# Patient Record
Sex: Male | Born: 1985 | Race: Black or African American | Hispanic: No | Marital: Single | Smoking: Current some day smoker
Health system: Southern US, Community
[De-identification: ages and names within clinical notes are randomized; demographics above are authoritative.]

## PROBLEM LIST (undated history)

## (undated) ENCOUNTER — Emergency Department (HOSPITAL_COMMUNITY): Payer: Medicaid Other | Source: Home / Self Care

---
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
## 2007-10-02 ENCOUNTER — Emergency Department (HOSPITAL_COMMUNITY): Admission: EM | Admit: 2007-10-02 | Discharge: 2007-10-02 | Payer: Self-pay | Admitting: Emergency Medicine

## 2008-07-06 ENCOUNTER — Emergency Department (HOSPITAL_COMMUNITY): Admission: EM | Admit: 2008-07-06 | Discharge: 2008-07-06 | Payer: Self-pay | Admitting: Family Medicine

## 2009-10-01 ENCOUNTER — Emergency Department (HOSPITAL_COMMUNITY): Admission: EM | Admit: 2009-10-01 | Discharge: 2009-10-01 | Payer: Self-pay | Admitting: Family Medicine

## 2009-10-09 ENCOUNTER — Emergency Department (HOSPITAL_COMMUNITY): Admission: EM | Admit: 2009-10-09 | Discharge: 2009-10-09 | Payer: Self-pay | Admitting: Emergency Medicine

## 2010-08-11 LAB — URINE CULTURE
Colony Count: NO GROWTH
Culture: NO GROWTH

## 2010-08-11 LAB — POCT URINALYSIS DIP (DEVICE)
Bilirubin Urine: NEGATIVE
Glucose, UA: NEGATIVE mg/dL
Ketones, ur: NEGATIVE mg/dL
Nitrite: NEGATIVE
Protein, ur: 100 mg/dL — AB
Specific Gravity, Urine: 1.02 (ref 1.005–1.030)
Urobilinogen, UA: 0.2 mg/dL (ref 0.0–1.0)
pH: 7.5 (ref 5.0–8.0)

## 2010-08-11 LAB — GC/CHLAMYDIA PROBE AMP, GENITAL: Chlamydia, DNA Probe: NEGATIVE
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
## 2013-11-15 ENCOUNTER — Encounter (HOSPITAL_COMMUNITY): Payer: Self-pay | Admitting: Emergency Medicine

## 2013-11-15 ENCOUNTER — Emergency Department (HOSPITAL_COMMUNITY)
Admission: EM | Admit: 2013-11-15 | Discharge: 2013-11-15 | Disposition: A | Discharge status: Home / Self Care | Payer: Medicaid Other | Attending: Emergency Medicine | Admitting: Emergency Medicine

## 2013-11-15 ENCOUNTER — Emergency Department (HOSPITAL_COMMUNITY): Payer: Medicaid Other

## 2013-11-15 DIAGNOSIS — N3090 Cystitis, unspecified without hematuria: Secondary | ICD-10-CM | POA: Insufficient documentation

## 2013-11-15 DIAGNOSIS — F17200 Nicotine dependence, unspecified, uncomplicated: Secondary | ICD-10-CM | POA: Insufficient documentation

## 2013-11-15 DIAGNOSIS — Z792 Long term (current) use of antibiotics: Secondary | ICD-10-CM | POA: Insufficient documentation

## 2013-11-15 DIAGNOSIS — A64 Unspecified sexually transmitted disease: Secondary | ICD-10-CM

## 2013-11-15 DIAGNOSIS — Z79899 Other long term (current) drug therapy: Secondary | ICD-10-CM | POA: Insufficient documentation

## 2013-11-15 LAB — URINE MICROSCOPIC-ADD ON

## 2013-11-15 LAB — URINALYSIS, ROUTINE W REFLEX MICROSCOPIC
Bilirubin Urine: NEGATIVE
Glucose, UA: NEGATIVE mg/dL
Ketones, ur: NEGATIVE mg/dL
NITRITE: NEGATIVE
Protein, ur: NEGATIVE mg/dL
SPECIFIC GRAVITY, URINE: 1.014 (ref 1.005–1.030)
UROBILINOGEN UA: 0.2 mg/dL (ref 0.0–1.0)
pH: 6 (ref 5.0–8.0)

## 2013-11-15 MED ORDER — CEFTRIAXONE SODIUM 250 MG IJ SOLR
250.0000 mg | Freq: Once | INTRAMUSCULAR | Status: AC
  Administered 2013-11-15: 250 mg via INTRAMUSCULAR
  Filled 2013-11-15: qty 250

## 2013-11-15 MED ORDER — DOXYCYCLINE HYCLATE 100 MG PO CAPS: 100.0000 mg | ORAL_CAPSULE | Freq: Two times a day (BID) | ORAL | Status: DC

## 2013-11-15 MED ORDER — AZITHROMYCIN 250 MG PO TABS
1000.0000 mg | ORAL_TABLET | Freq: Once | ORAL | Status: AC
  Administered 2013-11-15: 1000 mg via ORAL
  Filled 2013-11-15: qty 4

## 2013-11-15 MED ORDER — LIDOCAINE HCL (PF) 1 % IJ SOLN
5.0000 mL | Freq: Once | INTRAMUSCULAR | Status: AC
  Administered 2013-11-15: 5 mL
  Filled 2013-11-15: qty 5

## 2013-11-15 NOTE — Discharge Instructions (Signed)
Please call and set up an appointment with health department Please take antibiotics as prescribed - please take on a full stomach  Please avoid any sexual encounter until infection has been cleared and has been re-assessed Recommend partner to be assessed and treated Please continue to monitor symptoms closely and if symptoms are to worsen or change (fever greater than 101, chills, chest pain, shortness of breath, difficulty breathing, numbness, tingling, worsening or changes to pain pattern, blood in the urine, blood in the stools, black tarry stools, stomach pain, neck pain, neck stiffness, swelling to the penis or testicles, scrotal pain, rashes to the palms the hands and soles of the feet, sore throat, difficulty breathing) please report back to the ED immediately    Sexually Transmitted Disease A sexually transmitted disease (STD) is a disease or infection that may be passed (transmitted) from person to person, usually during sexual activity. This may happen by way of saliva, semen, blood, vaginal mucus, or urine. Common STDs include:   Gonorrhea.   Chlamydia.   Syphilis.   HIV and AIDS.   Genital herpes.   Hepatitis B and C.   Trichomonas.   Human papillomavirus (HPV).   Pubic lice.   Scabies.  Mites.  Bacterial vaginosis. WHAT ARE CAUSES OF STDs? An STD may be caused by bacteria, a virus, or parasites. STDs are often transmitted during sexual activity if one person is infected. However, they may also be transmitted through nonsexual means. STDs may be transmitted after:   Sexual intercourse with an infected person.   Sharing sex toys with an infected person.   Sharing needles with an infected person or using unclean piercing or tattoo needles.  Having intimate contact with the genitals, mouth, or rectal areas of an infected person.   Exposure to infected fluids during birth. WHAT ARE THE SIGNS AND SYMPTOMS OF STDs? Different STDs have different  symptoms. Some people may not have any symptoms. If symptoms are present, they may include:   Painful or bloody urination.   Pain in the pelvis, abdomen, vagina, anus, throat, or eyes.   A skin rash, itching, or irritation.  Growths, ulcerations, blisters, or sores in the genital and anal areas.  Abnormal vaginal discharge with or without bad odor.   Penile discharge in men.   Fever.   Pain or bleeding during sexual intercourse.   Swollen glands in the groin area.   Yellow skin and eyes (jaundice). This is seen with hepatitis.   Swollen testicles.  Infertility.  Sores and blisters in the mouth. HOW ARE STDs DIAGNOSED? To make a diagnosis, your health care provider may:   Take a medical history.   Perform a physical exam.   Take a sample of any discharge to examine.  Swab the throat, cervix, opening to the penis, rectum, or vagina for testing.  Test a sample of your first morning urine.   Perform blood tests.   Perform a Pap test, if this applies.   Perform a colposcopy.   Perform a laparoscopy.  HOW ARE STDs TREATED? Treatment depends on the STD. Some STDs may be treated but not cured.   Chlamydia, gonorrhea, trichomonas, and syphilis can be cured with antibiotic medicine.   Genital herpes, hepatitis, and HIV can be treated, but not cured, with prescribed medicines. The medicines lessen symptoms.   Genital warts from HPV can be treated with medicine or by freezing, burning (electrocautery), or surgery. Warts may come back.   HPV cannot be cured with medicine or  surgery. However, abnormal areas may be removed from the cervix, vagina, or vulva.   If your diagnosis is confirmed, your recent sexual partners need treatment. This is true even if they are symptom-free or have a negative culture or evaluation. They should not have sex until their health care providers say it is okay. HOW CAN I REDUCE MY RISK OF GETTING AN STD? Take these steps  to reduce your risk of getting an STD:  Use latex condoms, dental dams, and water-soluble lubricants during sexual activity. Do not use petroleum jelly or oils.  Avoid having multiple sex partners.  Do not have sex with someone who has other sex partners.  Do not have sex with anyone you do not know or who is at high risk for an STD.  Avoid risky sex practices that can break your skin.  Do not have sex if you have open sores on your mouth or skin.  Avoid drinking too much alcohol or taking illegal drugs. Alcohol and drugs can affect your judgment and put you in a vulnerable position.  Avoid engaging in oral and anal sex acts.  Get vaccinated for HPV and hepatitis. If you have not received these vaccines in the past, talk to your health care provider about whether one or both might be right for you.   If you are at risk of being infected with HIV, it is recommended that you take a prescription medicine daily to prevent HIV infection. This is called pre-exposure prophylaxis (PrEP). You are considered at risk if:  You are a man who has sex with other men (MSM).  You are a heterosexual man or woman and are sexually active with more than one partner.  You take drugs by injection.  You are sexually active with a partner who has HIV.  Talk with your health care provider about whether you are at high risk of being infected with HIV. If you choose to begin PrEP, you should first be tested for HIV. You should then be tested every 3 months for as long as you are taking PrEP.  WHAT SHOULD I DO IF I THINK I HAVE AN STD?  See your health care provider.   Tell your sexual partner(s). They should be tested and treated for any STDs.  Do not have sex until your health care provider says it is okay. WHEN SHOULD I GET IMMEDIATE MEDICAL CARE? Contact your health care provider right away if:   You have severe abdominal pain.  You are a man and notice swelling or pain in your  testicles.  You are a woman and notice swelling or pain in your vagina. Document Released: 07/31/2002 Document Revised: 05/15/2013 Document Reviewed: 11/28/2012 [facility] Patient Information 2015 [place], [place]. This information is not intended to replace advice given to you by your health care provider. Make sure you discuss any questions you have with your health care provider.   Emergency Department Resource Guide 1) Find a Doctor and Pay Out of Pocket Although you won't have to find out who is covered by your insurance plan, it is a good idea to ask around and get recommendations. You will then need to call the office and see if the doctor you have chosen will accept you as a new patient and what types of options they offer for patients who are self-pay. Some doctors offer discounts or will set up payment plans for their patients who do not have insurance, but you will need to ask so you aren't surprised  when you get to your appointment.  2) Contact Your Local Health Department Not all health departments have doctors that can see patients for sick visits, but many do, so it is worth a call to see if yours does. If you don't know where your local health department is, you can check in your phone book. The CDC also has a tool to help you locate your state's health department, and many state websites also have listings of all of their local health departments.  3) Find a Walk-in Clinic If your illness is not likely to be very severe or complicated, you may want to try a walk in clinic. These are popping up all over the country in pharmacies, drugstores, and shopping centers. They're usually staffed by nurse practitioners or physician assistants that have been trained to treat common illnesses and complaints. They're usually fairly quick and inexpensive. However, if you have serious medical issues or chronic medical problems, these are probably not your best option.  No Primary Care Doctor: - Call  Health Connect at  [phone] - they can help you locate a primary care doctor that  accepts your insurance, provides certain services, etc. - Physician Referral Service- [phone]  Chronic Pain Problems: Organization         Address  Phone   Notes  [facility]  [phone] Patients need to be referred by their primary care doctor.   Medication Assistance: Organization         Address  Phone   Notes  [facility] Medication [facility] [address] [place], [place] [address] [phone] --Must be a resident of [facility] -- Must have NO insurance coverage whatsoever (no Medicaid/ Medicare, etc.) -- The pt. MUST have a primary care doctor that directs their care regularly and follows them in the community   [facility]ist  [phone]   [employer]  [phone]    Agencies that provide inexpensive medical care: Organization         Address  Phone   Notes  [facility]  [phone]   [facility]    [phone]   [facility] [address] [place], [place] [address] [phone]   [facility] [address] [place], [place] [phone]   Planned Parenthood    [phone]   [facility]    [phone]   [facility]  [address], [place] Phone:  [phone], Fax:  [phone] Hours of Operation:  9 am - 6 pm, M-F.  Also accepts Medicaid/Medicare and self-pay.  [facility]  [address], [place] Phone: [phone], Fax: [phone]. Hours of Operation:  8:30 am - 5:30 pm, M-F.  Also accepts Medicaid and self-pay.  [facility] [place] [address] [place] Phone: [phone]   [facility] [address] Natasha Bence [place], [place] [phone], Ext. [phone] Mondays & Thursdays: 7-9 AM.  First 15 patients are seen on a first come, first serve  basis.    Medicaid-accepting [facility] Providers:  Organization         Address  Phone   Notes  [facility] [address], [place] [phone] Also accepts self-pay patients.  [facility] [address] Laurell Josephs [place], [place]  [phone]   [facility] [address] [place], [address], [place] [phone]   [facility] [address] [place] [phone]   Renaye Rakers [address], [place]   [phone] Only accepts [place] Access [place] patients after they have their name applied to their card.   Self-Pay (no insurance) in [facility]:  Organization         Address  Phone   Notes  Sickle Cell Patients, [facility] [address] [place], [place] [phone]   [facility] [address] [place], [place] [phone]   [facility] [place]  [address],  [phone]   [facility]/Dr. Osei-Bonsu  [address], [place] or [address], [place] [phone] Phone number for both [place] and [place] locations is the same.  [facility] and [facility] [address] [place] [phone]   [facility] [address] [place] or [address] [phone]   [facility] [address], [place] [phone], phone; [phone], fax Sees patients 1st and 3rd Saturday of every month.  Must not qualify for public or private insurance (i.e. Medicaid, Medicare, [place] Health Choice, Veterans' Benefits)  Household income should be no more than 200% of the poverty level The clinic cannot treat you if you are pregnant or think you are pregnant  Sexually transmitted diseases are not treated at the clinic.    Dental Care: Organization         Address  Phone  Notes  [facility] Department of [facility] [address] [place], [place] [phone] Accepts children up to age 40 who are enrolled in [place] or [place] Health Choice; pregnant women with a Medicaid card; and children who have applied for Medicaid or [place] Health Choice, but were declined, whose parents can pay a reduced fee at time of service.  [facility] Department of [facility]  [address], [place] [phone] Accepts children up to age 27 who are enrolled in [place] or [place] Health Choice; pregnant women with a Medicaid card; and children who have applied for Medicaid or  Health Choice, but were declined, whose parents can pay a reduced fee at time of service.  [facility]  [address] [place], [place] [phone] Patients are seen by appointment only. Walk-ins are not accepted. [facility] will see patients 34 years of age and older. Monday - Tuesday (8am-5pm) Most Wednesdays (8:30-5pm) $30 per visit, cash only  [facility] Adult Dental Access PRO[facility]  [address], [facility] [address] [phone] Patients are seen by appointment only. Walk-ins are not accepted. [facility] will see patients 69 years of age and older. One Wednesday Evening (Monthly: Volunteer Based).  $30 per visit, cash only  [employer]  [phone] for adults; Children under age 43, call Graduate Pediatric Dentistry at [phone]. Children aged 67-14, please call [phone] to request a pediatric application.  Dental services are provided in all areas of dental care including fillings, crowns and bridges, complete and partial dentures, implants, gum treatment, root canals, and extractions. Preventive care is also provided. Treatment is provided to both adults and children. Patients are selected via a lottery and there is often a waiting list.   [phone]  Dental Clinic [address], [place]in[place] Otto  [phone]  [email or web address]   [facility] [address] [place], [place] [phone], Ext. [phone] Second and Fourth Thursday of each month, opens at 6:30 AM; Clinic ends at 9 AM.  Patients are seen on a first-come first-served basis, and a limited number are seen during each clinic.   [facility]  [address] Ether Griffins [place], [place] [phone]   Eligibility Requirements You must have lived in [place], [place], or [place] counties for at least the last three months.   You cannot be eligible for state or federal sponsored [place], including [employer], [place], or [employer].   You generally cannot be eligible for healthcare insurance through your employer.    How to apply: Eligibility screenings are held every Tuesday and Wednesday afternoon from 1:00 pm until 4:00 pm. You do not need an appointment for the interview!  [facility] [address], [place], [place] [phone]   [facility] Health Department  [phone]   [facility] Health Department  [phone]   [facility] Department  [phone]    Behavioral Health Resources in the Community: Intensive Outpatient Programs Organization         Address  Phone  Notes  [facility] Services [address], [place], [place] [phone]   [facility] [address], [place], [place] [phone]   [facility] [address], [place], [place]  [phone]   [facility] [address]  [place], [place] [phone] or [phone]   Substance Abuse Resources Organization         Address  Phone  Notes  [facility]  [phone]   [facility]  [phone]   [facility]  [phone]   Floydene Flock  [phone]   Residential & Outpatient Substance Abuse Program  [phone]   Psychological Services Organization          Address  Phone  Notes  [facility] Health  [phone]   [facility] Services  [phone]   [facility] Mental Health [address] [place] [phone] or [phone]    Mobile Crisis Teams Organization         Address  Phone  Notes  [facility], Mobile Crisis Care Unit  [phone]   [facility]  [address] [place], [place] [phone]   Doristine Locks [address] [place] [phone]    Self-Help/Support Groups Organization         Address  Phone             Notes  [facility] [place] - variety of support groups  [phone]- I7437963 Call for more information  Narcotics Anonymous (NA), [facility] [address], [employer] [place]  2 meetings at this location   Statistician         Address  Phone  Notes  [facility] [address],    [place]  [phone]   [facility]  [address] [place] [address], [place], [place] [phone]   [facility] [address] [place], [place] [phone] Admissions: 8am-3pm M-F  [facility] [address]    [place], [place] [phone]   [facility] [address] [place], [place], [place] [phone]   [facility] [address],  [place], [place] [phone]   [facility] - Intensive Outpatient [address], [place], [place] [phone]   [facility] ([facility] [address]  [place], [place] [phone] or [phone]   [facility] ([facility]) [address] [place], [place] [phone] Accepts Medicaid  [facility] [address]  [place] [phone] Substance Abuse/Addiction Treatment   [facility] Behavioral Health Resources Organization         Address  Phone  Notes  [facility]  [phone]) [phone]   Angie FavaJulie Brannon, PhD [address] [place], [place]   [phone]) [phone] or [phone]   [facility]   [address] [place], [place] [phone]) [phone]   [facility] [address], [place], [place] [phone] Insurance/Medicaid/sponsorship through [facility]  [facility] [address]                                    [place], [place] [phone]) [phone] Therapy/tele-psych/case  [facility] [address].   [place], [place] [phone]) [phone]    Dr. Lolly MustacheArfeen  [phone]) [phone]   [facility] [place]  [employer] [facility] 1) [address], [place] 2) [address], [place] 3)  [address], [place] [phone]  [phone](336) [phone]   [facility] Child Abuse Hotline 6280896650(336) [phone] or [phone](336) [phone] (After Hours)

## 2013-11-15 NOTE — ED Provider Notes (Signed)
CSN: [id number]     Arrival date & time 11/15/13  1717 History   First MD Initiated Contact with Patient 11/15/13 2004     Chief Complaint  Patient presents with  . Dysuria     (Consider location/radiation/quality/duration/timing/severity/associated sxs/prior Treatment) The history is provided by the patient. No language interpreter was used.  Corey Schaefer  Is a [age] are old male with no known significant past medical history presenting to the ED with penile spotting of white discharge that started approximately 2 days ago. Patient reported that he's been having burning with urination-that started 2 days ago with each urination. Stated that he noticed a rash at the tip of his penis. Reported that he's been having mild left-sided testicular discomfort described as an aching sensation. Stated that he is sexually active, last encounter was approximately 5 days ago-denied protection. Denied over, hematuria, fever, chills, abdominal pain, nausea, vomiting, melena, hematochezia. PCP none  History reviewed. No pertinent past medical history. History reviewed. No pertinent past surgical history. History reviewed. No pertinent family history. History  Substance Use Topics  . Smoking status: Current Every Day Smoker -- 1.00 packs/day    Types: Cigarettes  . Smokeless tobacco: Not on file  . Alcohol Use: Yes     Comment: occ    Review of Systems  Constitutional: Negative for fever and chills.  Respiratory: Negative for chest tightness and shortness of breath.   Cardiovascular: Negative for chest pain.  Gastrointestinal: Negative for nausea, vomiting, abdominal pain, diarrhea and constipation.  Genitourinary: Positive for dysuria, discharge and scrotal swelling. Negative for frequency, hematuria, penile swelling, penile pain and testicular pain.  Musculoskeletal: Negative for back pain and neck pain.  Neurological: Negative for weakness.      Allergies  Review of patient's allergies  indicates no known allergies.  Home Medications   Prior to Admission medications   Medication Sig Start Date End Date Taking? Authorizing Provider  acetaminophen (TYLENOL) 500 MG tablet Take 500 mg by mouth every 6 (six) hours as needed.   Yes Historical Provider, MD  doxycycline (VIBRAMYCIN) 100 MG capsule Take 1 capsule (100 mg total) by mouth 2 (two) times daily. One po bid x 7 days 11/15/13   Joanna Hall, PA-C   BP 119/73  Pulse 69  Temp(Src) 98.5 F (36.9 C) (Oral)  Resp 18  Ht 6' (1.829 m)  Wt 165 lb 6.4 oz (75.025 kg)  BMI 22.43 kg/m2  SpO2 98% Physical Exam  Nursing note and vitals reviewed. Constitutional: He is oriented to person, place, and time. He appears well-developed and well-nourished. No distress.  HENT:  Head: Normocephalic and atraumatic.  Mouth/Throat: Oropharynx is clear and moist. No oropharyngeal exudate.  Eyes: Conjunctivae and EOM are normal. Pupils are equal, round, and reactive to light. Right eye exhibits no discharge. Left eye exhibits discharge.  Neck: Normal range of motion. Neck supple. No tracheal deviation present.  Negative neck stiffness Negative nuchal rigidity Next cervical lymphadenopathy Negative meningeal signs  Cardiovascular: Normal rate, regular rhythm and normal heart sounds.  Exam reveals no friction rub.   No murmur heard. Pulmonary/Chest: Effort normal and breath sounds normal. No respiratory distress. He has no wheezes. He has no rales.  Abdominal: Soft. Bowel sounds are normal. He exhibits no distension. There is no tenderness. There is no rebound and no guarding.  Negative abdominal distention noted Bowel sounds normoactive in all 4 quadrants Abdomen soft upon palpation Negative peritoneal signs Negative rigidity or guarding noted  Genitourinary:  Pelvic exam: Mild swelling identified to the left testicle with discomfort upon palpation to the posterior aspect with negative erythema. Negative warmth upon palpation.  Right testicular exam unremarkable. Negative swelling to the scrotal sac noted. Negative swelling identified to the penis. Mild beginnings of possible chancre identified to the glans penis at approximately 2 o'clock. Active drainage identified of thick white discharge. Inguinal lymphadenopathy bilaterally. Exam chaperoned with nurse  Musculoskeletal: Normal range of motion.  Full ROM to upper and lower extremities without difficulty noted, negative ataxia noted.  Lymphadenopathy:    He has no cervical adenopathy.  Neurological: He is alert and oriented to person, place, and time. No cranial nerve deficit. He exhibits normal muscle tone. Coordination normal.  Skin: Skin is warm and dry. No rash noted. He is not diaphoretic. No erythema.  Negative rashes to the palms the hands and soles of the feet  Psychiatric: He has a normal mood and affect. His behavior is normal. Thought content normal.    ED Course  Procedures (including critical care time)  Results for orders placed during the hospital encounter of 11/15/13  URINALYSIS, ROUTINE W REFLEX MICROSCOPIC      Result Value Ref Range   Color, Urine YELLOW  YELLOW   APPearance CLOUDY (*) CLEAR   Specific Gravity, Urine 1.014  1.005 - 1.030   pH 6.0  5.0 - 8.0   Glucose, UA NEGATIVE  NEGATIVE mg/dL   Hgb urine dipstick MODERATE (*) NEGATIVE   Bilirubin Urine NEGATIVE  NEGATIVE   Ketones, ur NEGATIVE  NEGATIVE mg/dL   Protein, ur NEGATIVE  NEGATIVE mg/dL   Urobilinogen, UA 0.2  0.0 - 1.0 mg/dL   Nitrite NEGATIVE  NEGATIVE   Leukocytes, UA LARGE (*) NEGATIVE  URINE MICROSCOPIC-ADD ON      Result Value Ref Range   Squamous Epithelial / LPF FEW (*) RARE   WBC, UA TOO NUMEROUS TO COUNT  <3 WBC/hpf   RBC / HPF 7-10  <3 RBC/hpf   Bacteria, UA FEW (*) RARE   Urine-Other MUCOUS PRESENT      Labs Review Labs Reviewed  URINALYSIS, ROUTINE W REFLEX MICROSCOPIC - Abnormal; Notable for the following:    APPearance CLOUDY (*)    Hgb urine  dipstick MODERATE (*)    Leukocytes, UA LARGE (*)    All other components within normal limits  URINE MICROSCOPIC-ADD ON - Abnormal; Notable for the following:    Squamous Epithelial / LPF FEW (*)    Bacteria, UA FEW (*)    All other components within normal limits  GC/CHLAMYDIA PROBE AMP  URINE CULTURE  RPR  HIV ANTIBODY (ROUTINE TESTING)    Imaging Review US Scrotum  11/15/2013   CLINICAL DATA:  Scrotal pain and swelling.  EXAM: SCROTAL ULTRASOUND  DOPPLER ULTRASOUND OF THE TESTICLES  TECHNIQUE: Complete ultrasound examination of the testicles, epididymis, and other scrotal structures was performed. Color and spectral Doppler ultrasound were also utilized to evaluate blood flow to the testicles.  COMPARISON:  None.  FINDINGS: Right testicle  Measurements: 4.7 x 2.6 x 2.9 cm. No mass or microlithiasis visualized.  Left testicle  Measurements: 4.8 x 2.5 x 2.7 cm. No mass or microlithiasis visualized.  Right epididymis: Epididymal head 8 mm in length, without abnormal echogenicity.  Left epididymis: Epididymal head 6 mm in length, without abnormal echogenicity.  Hydrocele:  None visualized.  Varicocele:  None visualized.  Pulsed Doppler interrogation of both testes demonstrates low resistance arterial and venous waveforms bilaterally.  IMPRESSION: 1.  No specific abnormality identified. The right epididymal head is slightly larger than the left, probably incidental, less likely due to a subtle manifestation of epididymitis.   Electronically Signed   By: Herbie BaltimoreWalt  Liebkemann M.D.   On: 11/15/2013 22:44   [place] Art/ven Flow Abd Pelv Doppler  11/15/2013   CLINICAL DATA:  Scrotal pain and swelling.  EXAM: SCROTAL ULTRASOUND  DOPPLER ULTRASOUND OF THE TESTICLES  TECHNIQUE: Complete ultrasound examination of the testicles, epididymis, and other scrotal structures was performed. Color and spectral Doppler ultrasound were also utilized to evaluate blood flow to the testicles.  COMPARISON:  None.  FINDINGS: Right  testicle  Measurements: 4.7 x 2.6 x 2.9 cm. No mass or microlithiasis visualized.  Left testicle  Measurements: 4.8 x 2.5 x 2.7 cm. No mass or microlithiasis visualized.  Right epididymis: Epididymal head 8 mm in length, without abnormal echogenicity.  Left epididymis: Epididymal head 6 mm in length, without abnormal echogenicity.  Hydrocele:  None visualized.  Varicocele:  None visualized.  Pulsed Doppler interrogation of both testes demonstrates low resistance arterial and venous waveforms bilaterally.  IMPRESSION: 1. No specific abnormality identified. The right epididymal head is slightly larger than the left, probably incidental, less likely due to a subtle manifestation of epididymitis.   Electronically Signed   By: Herbie BaltimoreWalt  Liebkemann M.D.   On: 11/15/2013 22:44     EKG Interpretation None      MDM   Final diagnoses:  STD (male)  Cystitis    Medications  cefTRIAXone (ROCEPHIN) injection 250 mg (250 mg Intramuscular Given 11/15/13 2121)  azithromycin (ZITHROMAX) tablet 1,000 mg (1,000 mg Oral Given 11/15/13 2121)  lidocaine (PF) (XYLOCAINE) 1 % injection 5 mL (5 mLs Other Given 11/15/13 2121)   Filed Vitals:   11/15/13 1733  BP: 119/73  Pulse: 69  Temp: 98.5 F (36.9 C)  TempSrc: Oral  Resp: 18  Height: 6' (1.829 m)  Weight: 165 lb 6.4 oz (75.025 kg)  SpO2: 98%   Urinalysis noted large leukocytes with white blood cells too numerous to count-moderate hemoglobin identified. Urine culture pending. GC Chlamydia probe pending. HIV antibody and RPR pending.Ultrasound of the scrotum no specific abnormalities identified-right epididymal head is slightly larger than the left probably incidental, less likely due to a subtle manifestation of epididymitis. Negative findings of torsion. Patient treated prophylactically for STD while in ED setting. Suspicion to be STD - possible beginnings of syphilis secondary to lesion on tip of penis. Will cover for infection. Patient stable, afebrile. Patient  not septic appearing. Discharged patient. Discharged patient with doxycycline. Referred patient to health department. Discussed with patient to avoid any sexual activity. Recommended partner to be assessed. Discussed with patient to closely monitor symptoms and if symptoms are to worsen or change to report back to the ED - strict return instructions given.  Patient agreed to plan of care, understood, all questions answered.   Raymon MuttonMarissa Zhuri Krass, PA-C 11/16/13 [phone]

## 2013-11-15 NOTE — ED Notes (Addendum)
PT states he has dysuria x2 days.  States he has white penile discharge x2 days.  PT asked if we give pills for toothache.

## 2013-11-15 NOTE — ED Notes (Signed)
Phlebotomy at bedside for blood draw.

## 2013-11-16 LAB — HIV ANTIBODY (ROUTINE TESTING W REFLEX): HIV 1&2 Ab, 4th Generation: NONREACTIVE

## 2013-11-16 LAB — GC/CHLAMYDIA PROBE AMP
CT PROBE, AMP APTIMA: NEGATIVE
GC Probe RNA: POSITIVE — AB

## 2013-11-16 LAB — RPR

## 2013-11-16 NOTE — ED Provider Notes (Signed)
Medical screening examination/treatment/procedure(s) were performed by non-physician practitioner and as supervising physician I was immediately available for consultation/collaboration.   EKG Interpretation None        Gwyneth SproutWhitney Plunkett, MD 11/16/13 1407

## 2013-11-17 LAB — URINE CULTURE
COLONY COUNT: NO GROWTH
CULTURE: NO GROWTH
Special Requests: NORMAL

## 2013-11-18 ENCOUNTER — Telehealth (HOSPITAL_BASED_OUTPATIENT_CLINIC_OR_DEPARTMENT_OTHER): Payer: Self-pay | Admitting: Emergency Medicine

## 2013-11-18 NOTE — Telephone Encounter (Signed)
Positive Gonorrhea Culture Treated with Rocephin and Zithromax Will contact patient  [facility] faxed  11/18/13 @ 1530  - no answer

## 2013-11-25 NOTE — Telephone Encounter (Signed)
Unable to contact patient via phone. Sent letter. °

## 2013-12-18 ENCOUNTER — Telehealth (HOSPITAL_BASED_OUTPATIENT_CLINIC_OR_DEPARTMENT_OTHER): Payer: Self-pay | Admitting: Emergency Medicine

## 2013-12-18 NOTE — Telephone Encounter (Signed)
No response to letter sent after 30 days. Chart sent to Medical Records. °

## 2014-03-11 ENCOUNTER — Encounter (HOSPITAL_COMMUNITY): Payer: Self-pay | Admitting: Emergency Medicine

## 2014-03-11 ENCOUNTER — Emergency Department (HOSPITAL_COMMUNITY)
Admission: EM | Admit: 2014-03-11 | Discharge: 2014-03-11 | Disposition: A | Discharge status: Home / Self Care | Payer: Medicaid Other | Attending: Emergency Medicine | Admitting: Emergency Medicine

## 2014-03-11 DIAGNOSIS — K029 Dental caries, unspecified: Secondary | ICD-10-CM | POA: Insufficient documentation

## 2014-03-11 DIAGNOSIS — K088 Other specified disorders of teeth and supporting structures: Secondary | ICD-10-CM | POA: Diagnosis present

## 2014-03-11 DIAGNOSIS — Z720 Tobacco use: Secondary | ICD-10-CM | POA: Diagnosis not present

## 2014-03-11 DIAGNOSIS — K047 Periapical abscess without sinus: Secondary | ICD-10-CM | POA: Diagnosis not present

## 2014-03-11 MED ORDER — IBUPROFEN 800 MG PO TABS: 800.0000 mg | ORAL_TABLET | Freq: Three times a day (TID) | ORAL | Status: DC | PRN

## 2014-03-11 MED ORDER — HYDROCODONE-ACETAMINOPHEN 5-325 MG PO TABS: 1.0000 | ORAL_TABLET | Freq: Four times a day (QID) | ORAL | Status: AC | PRN

## 2014-03-11 MED ORDER — PENICILLIN V POTASSIUM 500 MG PO TABS: 500.0000 mg | ORAL_TABLET | Freq: Four times a day (QID) | ORAL | Status: AC

## 2014-03-11 NOTE — ED Provider Notes (Signed)
CSN: [id number]     Arrival date & time 03/11/14  2018 History   First MD Initiated Contact with Patient 03/11/14 2104     Chief Complaint  Patient presents with  . Dental Pain     (Consider location/radiation/quality/duration/timing/severity/associated sxs/prior Treatment) HPI Patient presents to the emergency department with dental pain that has been bothering him over the last 2 days.  The patient, states his right upper molars, and right lower molar is painful with swelling to the right upper gumline.  The patient, states, that nothing seems to make his condition, better, but palpation makes the pain, worse and movement.  Patient, states he did not take any medications prior to arrival.  The patient denies nausea, vomiting, weakness, numbness, dizziness, headache, blurred vision, back pain, neck pain, chest pain, shortness of breath, fever, or syncope.  The patient, states, that he's had dental problems for quite a while      History reviewed. No pertinent past medical history. History reviewed. No pertinent past surgical history. No family history on file. History  Substance Use Topics  . Smoking status: Current Every Day Smoker -- 1.00 packs/day    Types: Cigarettes  . Smokeless tobacco: Not on file  . Alcohol Use: Yes     Comment: occ    Review of Systems  All other systems negative except as documented in the HPI. All pertinent positives and negatives as reviewed in the HPI.  Allergies  Review of patient's allergies indicates no known allergies.  Home Medications   Prior to Admission medications   Medication Sig Start Date End Date Taking? Authorizing Provider  acetaminophen (TYLENOL) 500 MG tablet Take 500 mg by mouth every 6 (six) hours as needed.    Historical Provider, MD  doxycycline (VIBRAMYCIN) 100 MG capsule Take 1 capsule (100 mg total) by mouth 2 (two) times daily. One po bid x 7 days 11/15/13   Marissa Sciacca, PA-C   BP 134/80  Pulse 87  Temp(Src) 97.8  F (36.6 C) (Oral)  Resp 18  Ht 6' (1.829 m)  Wt 175 lb (79.379 kg)  BMI 23.73 kg/m2  SpO2 100% Physical Exam  Constitutional: He appears well-developed and well-nourished. No distress.  HENT:  Head: Normocephalic and atraumatic.  Mouth/Throat: Uvula is midline and oropharynx is clear and moist. No uvula swelling.    Eyes: Pupils are equal, round, and reactive to light.  Neck: Normal range of motion. Neck supple.    ED Course  Procedures (including critical care time)   Patient will be referred to dentistry and told to return here as needed.  Told to increase his fluid intake.  Rinse with warm water and peroxide 3 times a day    Carlyle DollyChristopher W Iolanda Folson, [place] JerseyPA-C 03/11/14 2147

## 2014-03-11 NOTE — ED Notes (Signed)
Pt c/o pain in wisdom teeth bil x's 2 days.

## 2014-03-11 NOTE — ED Notes (Signed)
Social work at bedside giving resources for dental work.

## 2014-03-12 NOTE — ED Provider Notes (Signed)
Medical screening examination/treatment/procedure(s) were performed by non-physician practitioner and as supervising physician I was immediately available for consultation/collaboration.   EKG Interpretation None       Juliet RudeNathan R. Rubin PayorPickering, MD 03/12/14 1515

## 2014-04-14 ENCOUNTER — Emergency Department (HOSPITAL_COMMUNITY)
Admission: EM | Admit: 2014-04-14 | Discharge: 2014-04-14 | Disposition: A | Discharge status: Home / Self Care | Payer: Medicaid Other | Attending: Emergency Medicine | Admitting: Emergency Medicine

## 2014-04-14 ENCOUNTER — Encounter (HOSPITAL_COMMUNITY): Payer: Self-pay | Admitting: Family Medicine

## 2014-04-14 DIAGNOSIS — K089 Disorder of teeth and supporting structures, unspecified: Secondary | ICD-10-CM

## 2014-04-14 DIAGNOSIS — K120 Recurrent oral aphthae: Secondary | ICD-10-CM | POA: Insufficient documentation

## 2014-04-14 DIAGNOSIS — G8929 Other chronic pain: Secondary | ICD-10-CM

## 2014-04-14 DIAGNOSIS — Z720 Tobacco use: Secondary | ICD-10-CM | POA: Diagnosis not present

## 2014-04-14 DIAGNOSIS — Z792 Long term (current) use of antibiotics: Secondary | ICD-10-CM | POA: Insufficient documentation

## 2014-04-14 DIAGNOSIS — K088 Other specified disorders of teeth and supporting structures: Secondary | ICD-10-CM | POA: Diagnosis present

## 2014-04-14 DIAGNOSIS — K029 Dental caries, unspecified: Secondary | ICD-10-CM | POA: Insufficient documentation

## 2014-04-14 MED ORDER — HYDROCODONE-ACETAMINOPHEN 5-325 MG PO TABS
1.0000 | ORAL_TABLET | Freq: Once | ORAL | Status: AC
  Administered 2014-04-14: 1 via ORAL
  Filled 2014-04-14: qty 1

## 2014-04-14 MED ORDER — FIRST-MOUTHWASH BLM MT SUSP: 10.0000 mL | Freq: Three times a day (TID) | OROMUCOSAL | Status: AC | PRN

## 2014-04-14 MED ORDER — NAPROXEN 500 MG PO TABS: 500.0000 mg | ORAL_TABLET | Freq: Two times a day (BID) | ORAL | Status: DC | PRN

## 2014-04-14 MED ORDER — PENICILLIN V POTASSIUM 500 MG PO TABS: 500.0000 mg | ORAL_TABLET | Freq: Four times a day (QID) | ORAL | Status: AC

## 2014-04-14 MED ORDER — HYDROCODONE-ACETAMINOPHEN 5-325 MG PO TABS: 1.0000 | ORAL_TABLET | Freq: Four times a day (QID) | ORAL | Status: AC | PRN

## 2014-04-14 NOTE — ED Provider Notes (Signed)
CSN: [id number]     Arrival date & time 04/14/14  1140 History   First MD Initiated Contact with Patient 04/14/14 1239     Chief Complaint  Patient presents with  . Dental Pain     (Consider location/radiation/quality/duration/timing/severity/associated sxs/prior Treatment) HPI Comments: Corey Schaefer is a 28 y.o. Healthy male he presents to the ED for recurrent right upper and lower dental pain, which has occurred before, and this time began gradually 3 days ago. Pain is 9/10 achy right upper and lower gumline pain with posterior molars involved, radiating to the cheek, worse with pressure, air, and chewing hard food, and unrelieved with Tylenol and salt water gargles. He reports that last month he was given Vicodin which helped, and he started taking penicillin VK but he was taken is inappropriately twice a day instead of 4 times a day. He denies any dental fracture or injury, and hasn't seen a dentist. Endorses smoking half a pack per day. Endorses associated gum swelling, facial swelling and pain, as well as a small ulceration in the inside of his cheek. Denies fevers, chills, gum discharge, vision changes, headache, neck pain or stiffness, drooling, trismus, abdominal pain, nausea, vomiting, or paresthesias of the face.  Patient is a 28 y.o. male presenting with tooth pain. The history is provided by the patient. No language interpreter was used.  Dental Pain Location:  Upper and lower Upper teeth location:  1/RU 3rd molar and 2/RU 2nd molar Lower teeth location:  32/RL 3rd molar and 31/RL 2nd molar Quality:  Aching Severity:  Severe (9/10) Onset quality:  Gradual Duration:  3 days Timing:  Constant Progression:  Worsening Chronicity:  Recurrent Context: poor dentition   Relieved by:  Nothing Worsened by:  Touching and pressure Ineffective treatments:  Acetaminophen Associated symptoms: facial pain, facial swelling, gum swelling and oral lesions   Associated symptoms: no  congestion, no difficulty swallowing, no drooling, no fever, no headaches, no neck pain, no neck swelling, no oral bleeding and no trismus   Risk factors: lack of dental care and smoking     History reviewed. No pertinent past medical history. History reviewed. No pertinent past surgical history. History reviewed. No pertinent family history. History  Substance Use Topics  . Smoking status: Current Every Day Smoker -- 1.00 packs/day    Types: Cigarettes  . Smokeless tobacco: Not on file  . Alcohol Use: Yes     Comment: occ    Review of Systems  Constitutional: Negative for fever and chills.  HENT: Positive for dental problem, facial swelling and mouth sores. Negative for congestion, drooling, ear discharge, ear pain, rhinorrhea, sinus pressure, sore throat and trouble swallowing.   Eyes: Negative for pain and redness.  Respiratory: Negative for shortness of breath.   Gastrointestinal: Negative for nausea, vomiting and abdominal pain.  Musculoskeletal: Negative for neck pain and neck stiffness.  Skin: Negative for color change.  Neurological: Negative for weakness, numbness and headaches.   10 Systems reviewed and are negative for acute change except as noted in the HPI.    Allergies  Review of patient's allergies indicates no known allergies.  Home Medications   Prior to Admission medications   Medication Sig Start Date End Date Taking? Authorizing Provider  HYDROcodone-acetaminophen (NORCO/VICODIN) 5-325 MG per tablet Take 1 tablet by mouth every 6 (six) hours as needed for moderate pain. 03/11/14   Jamesetta Orleanshristopher W Lawyer, PA-C  ibuprofen (ADVIL,MOTRIN) 200 MG tablet Take 200 mg by mouth every 6 (six) hours as  needed for mild pain.    Historical Provider, MD  ibuprofen (ADVIL,MOTRIN) 800 MG tablet Take 1 tablet (800 mg total) by mouth every 8 (eight) hours as needed. 03/11/14   Jamesetta Orleans Lawyer, PA-C  penicillin v potassium (VEETID) 500 MG tablet Take 1 tablet (500 mg total)  by mouth 4 (four) times daily. 03/11/14   Jamesetta Orleans Lawyer, PA-C   BP 141/95 mmHg  Pulse 62  Temp(Src) 98.6 F (37 C)  Ht 6' (1.829 m)  Wt 175 lb (79.379 kg)  BMI 23.73 kg/m2  SpO2 98% Physical Exam  Constitutional: He is oriented to person, place, and time. Vital signs are normal. He appears well-developed and well-nourished.  Non-toxic appearance. No distress.  Afebrile, nontoxic, NAD although appears uncomfortable  HENT:  Head: Normocephalic and atraumatic.  Nose: Nose normal.  Mouth/Throat: Oropharynx is clear and moist and mucous membranes are normal. Oral lesions present. No trismus in the jaw. Abnormal dentition. Dental caries present. No dental abscesses or uvula swelling.    Nose clear bilaterally. Mild facial swelling to R sided cheek, no erythema or warmth Buccal mucosa of R cheek with small aphthous ulcer. RL and RU molars #1-2 and 31-32 TTP with caries and gingival swelling/erythema suspicious for abscess but no obvious drainable abscess. No drooling or trismus. Oropharynx clear and moist. subfloor of mouth without TTP or induration.  Eyes: Conjunctivae and EOM are normal. Right eye exhibits no discharge. Left eye exhibits no discharge.  Neck: Normal range of motion. Neck supple.  Cardiovascular: Normal rate and intact distal pulses.   Pulmonary/Chest: Effort normal. No respiratory distress.  Abdominal: Normal appearance. He exhibits no distension.  Musculoskeletal: Normal range of motion.  Lymphadenopathy:       Head (right side): Submandibular adenopathy present.  R sided tender submandibular LAD  Neurological: He is alert and oriented to person, place, and time. He has normal strength. No sensory deficit.  Skin: Skin is warm, dry and intact. No rash noted.  Psychiatric: He has a normal mood and affect.  Nursing note and vitals reviewed.   ED Course  Procedures (including critical care time) Labs Review Labs Reviewed - No data to display  Imaging  Review No results found.   EKG Interpretation None      MDM   Final diagnoses:  Chronic dental pain  Dental caries  Aphthous ulcer of mouth    28y/o male with recurrent dental pain due to dental decay/caries and possible dental abscess with patient afebrile, non toxic appearing and swallowing secretions well. Also has small aphthous ulcer to buccal mucosal of R cheek. Moderate facial swelling but pt handing secretions. Buccal subfloor soft without TTP, doubt ludwig's angina or deep space infection. I gave patient referral to dentist and stressed the importance of dental follow up for ultimate management of dental pain.  I have also discussed reasons to return immediately to the ER.  Patient expresses understanding and agrees with plan. Norco given here for relief of pain. Will give norco, naprosyn, magic mouthwash, and PCN VK. Discussed smoking cessation.   BP 141/95 mmHg  Pulse 62  Temp(Src) 98.6 F (37 C)  Ht 6' (1.829 m)  Wt 175 lb (79.379 kg)  BMI 23.73 kg/m2  SpO2 98%  Meds ordered this encounter  Medications  . HYDROcodone-acetaminophen (NORCO/VICODIN) 5-325 MG per tablet 1 tablet    Sig:   . penicillin v potassium (VEETID) 500 MG tablet    Sig: Take 1 tablet (500 mg total) by mouth 4 (four)  times daily. X 10 days    Dispense:  40 tablet    Refill:  0    Order Specific Question:  Supervising Provider    Answer:  Eber HongMILLER, BRIAN D [[id number]]  . HYDROcodone-acetaminophen (NORCO) 5-325 MG per tablet    Sig: Take 1-2 tablets by mouth every 6 (six) hours as needed for severe pain.    Dispense:  10 tablet    Refill:  0    Order Specific Question:  Supervising Provider    Answer:  Eber HongMILLER, BRIAN D [[id number]]  . naproxen (NAPROSYN) 500 MG tablet    Sig: Take 1 tablet (500 mg total) by mouth 2 (two) times daily as needed for mild pain, moderate pain or headache (TAKE WITH MEALS.).    Dispense:  20 tablet    Refill:  0    Order Specific Question:  Supervising Provider    Answer:   Eber HongMILLER, BRIAN D [[id number]]  . DPH-Lido-AlHydr-MgHydr-Simeth (FIRST-MOUTHWASH BLM) SUSP    Sig: Use as directed 10 mLs in the mouth or throat 3 (three) times daily as needed (mouth pain).    Dispense:  119 mL    Refill:  0    Order Specific Question:  Supervising Provider    Answer:  Eber HongMILLER, BRIAN D [[id number]]     Donnita FallsMercedes Strupp Camprubi-Soms, PA-C 04/14/14 1309  Juliet RudeNathan R. Rubin PayorPickering, MD 04/14/14 [phone]

## 2014-04-14 NOTE — Discharge Instructions (Signed)
Apply warm compresses to jaw throughout the day. Take antibiotic until finished, and take it as directed. Take naprosyn and norco as directed, as needed for pain but do not drive or operate machinery with pain medication use. Use magic mouthwash as directed for your mouth pain. STOP SMOKING! Followup with a dentist is very important for ongoing evaluation and management of recurrent dental pain. Find a dentist using the list below or call the dentist you've been referred to above. Return to emergency department for emergent changing or worsening symptoms.    Dental Pain A tooth ache may be caused by cavities (tooth decay). Cavities expose the nerve of the tooth to air and hot or cold temperatures. It may come from an infection or abscess (also called a boil or furuncle) around your tooth. It is also often caused by dental caries (tooth decay). This causes the pain you are having. DIAGNOSIS  Your caregiver can diagnose this problem by exam. TREATMENT   If caused by an infection, it may be treated with medications which kill germs (antibiotics) and pain medications as prescribed by your caregiver. Take medications as directed.  Only take over-the-counter or prescription medicines for pain, discomfort, or fever as directed by your caregiver.  Whether the tooth ache today is caused by infection or dental disease, you should see your dentist as soon as possible for further care. SEEK MEDICAL CARE IF: The exam and treatment you received today has been provided on an emergency basis only. This is not a substitute for complete medical or dental care. If your problem worsens or new problems (symptoms) appear, and you are unable to meet with your dentist, call or return to this location. SEEK IMMEDIATE MEDICAL CARE IF:   You have a fever.  You develop redness and swelling of your face, jaw, or neck.  You are unable to open your mouth.  You have severe pain uncontrolled by pain medicine. MAKE SURE YOU:    Understand these instructions.  Will watch your condition.  Will get help right away if you are not doing well or get worse. Document Released: 05/10/2005 Document Revised: 08/02/2011 Document Reviewed: 12/27/2007 [facility] Patient Information 2015 [place], [place]. This information is not intended to replace advice given to you by your health care provider. Make sure you discuss any questions you have with your health care provider.  Dental Caries Dental caries is tooth decay. This decay can cause a hole in teeth (cavity) that can get bigger and deeper over time. HOME CARE  Brush and floss your teeth. Do this at least two times a day.  Use a fluoride toothpaste.  Use a mouth rinse if told by your dentist or doctor.  Eat less sugary and starchy foods. Drink less sugary drinks.  Avoid snacking often on sugary and starchy foods. Avoid sipping often on sugary drinks.  Keep regular checkups and cleanings with your dentist.  Use fluoride supplements if told by your dentist or doctor.  Allow fluoride to be applied to teeth if told by your dentist or doctor. Document Released: 02/17/2008 Document Revised: 09/24/2013 Document Reviewed: 05/12/2012 [facility] Patient Information 2015 [place], [place]. This information is not intended to replace advice given to you by your health care provider. Make sure you discuss any questions you have with your health care provider.  Oral Ulcers Oral ulcers are painful, shallow sores around the lining of the mouth. They can affect the gums, the inside of the lips, and the cheeks. (Sores on the outside of the  lips and on the face are different.) They typically first occur in school-aged children and teenagers. Oral ulcers may also be called canker sores or cold sores. CAUSES  Canker sores and cold sores can be caused by many factors including:  Infection.  Injury.  Sun exposure.  Medications.  Emotional stress.  Food allergies.  Vitamin  deficiencies.  Toothpastes containing sodium lauryl sulfate. The herpes virus can be the cause of mouth ulcers. The first infection can be severe and cause 10 or more ulcers on the gums, tongue, and lips with fever and difficulty in swallowing. This infection usually occurs between the ages of 1 and 3 years.  SYMPTOMS  The typical sore is about  inch (6 mm) in size and is an oval or round ulcer with red borders. DIAGNOSIS  Your caregiver can diagnose simple oral ulcers by examination. Additional testing is usually not required.  TREATMENT  Treatment is aimed at pain relief. Generally, oral ulcers resolve by themselves within 1 to 2 weeks without medication and are not contagious unless caused by herpes (and other viruses). Antibiotics are not effective with mouth sores. Avoid direct contact with others until the ulcer is completely healed. See your caregiver for follow-up care as recommended. Also:  Offer a soft diet.  Encourage plenty of fluids to prevent dehydration. Popsicles and milk shakes can be helpful.  Avoid acidic and salty foods and drinks such as orange juice.  Infants and young children will often refuse to drink because of pain. Using a teaspoon, cup, or syringe to give small amounts of fluids frequently can help prevent dehydration.  Cold compresses on the face may help reduce pain.  Pain medication can help control soreness.  A solution of diphenhydramine mixed with a liquid antacid can be useful to decrease the soreness of ulcers. Consult a caregiver for the dosing.  Liquids or ointments with a numbing ingredient may be helpful when used as recommended.  Older children and teenagers can rinse their mouth with a salt-water mixture (1/2 teaspoon of salt in 8 ounces of water) four times a day. This treatment is uncomfortable but may reduce the time the ulcers are present.  There are many over-the-counter throat lozenges and medications available for oral ulcers. Their  effectiveness has not been studied.  Consult your medical caregiver prior to using homeopathic treatments for oral ulcers. SEEK MEDICAL CARE IF:   You think your child needs to be seen.  The pain worsens and you cannot control it.  There are 4 or more ulcers.  The lips and gums begin to bleed and crust.  A single mouth ulcer is near a tooth that is causing a toothache or pain.  Your child has a fever, swollen face, or swollen glands.  The ulcers began after starting a medication.  Mouth ulcers keep reoccurring or last more than 2 weeks.  You think your child is not taking adequate fluids. SEEK IMMEDIATE MEDICAL CARE IF:   Your child has a high fever.  Your child is unable to swallow or becomes dehydrated.  Your child looks or acts very ill.  An ulcer caused by a chemical your child accidentally put in their mouth. Document Released: 06/17/2004 Document Revised: 09/24/2013 Document Reviewed: 01/30/2009 [facility] Patient Information 2015 [place]. This information is not intended to replace advice given to you by your health care provider. Make sure you discuss any questions you have with your health care provider. Emergency Department Resource Guide 1) Find a Doctor and Pay Out  of Pocket Although you won't have to find out who is covered by your insurance plan, it is a good idea to ask around and get recommendations. You will then need to call the office and see if the doctor you have chosen will accept you as a new patient and what types of options they offer for patients who are self-pay. Some doctors offer discounts or will set up payment plans for their patients who do not have insurance, but you will need to ask so you aren't surprised when you get to your appointment.  2) Contact Your Local Health Department Not all health departments have doctors that can see patients for sick visits, but many do, so it is worth a call to see if yours does. If you don't know where  your local health department is, you can check in your phone book. The CDC also has a tool to help you locate your state's health department, and many state websites also have listings of all of their local health departments.  3) Find a Walk-in Clinic If your illness is not likely to be very severe or complicated, you may want to try a walk in clinic. These are popping up all over the country in pharmacies, drugstores, and shopping centers. They're usually staffed by nurse practitioners or physician assistants that have been trained to treat common illnesses and complaints. They're usually fairly quick and inexpensive. However, if you have serious medical issues or chronic medical problems, these are probably not your best option.  No Primary Care Doctor: - Call [facility] Connect at  [phone] - they can help you locate a primary care doctor that  accepts your insurance, provides certain services, etc. - Physician Referral Service- [phone]  Chronic Pain Problems: Organization         Address  Phone   Notes  [facility]  [phone] Patients need to be referred by their primary care doctor.   Medication Assistance: Organization         Address  Phone   Notes  [facility] [address] [place], [place] [id number] [phone] --Must be a resident of [place] -- Must have NO insurance coverage whatsoever (no Medicaid/ Medicare, etc.) -- The pt. MUST have a primary care doctor that directs their care regularly and follows them in the community   [facility]  [phone]   [employer]  [phone]     Dental Care: Organization         Address  Phone  Notes  [facility] [address], [place] [phone] Accepts children up to age 27 who are enrolled in [place] or [place] Health Choice; pregnant women with a Medicaid card; and children who have  applied for Medicaid or [place] Health Choice, but were declined, whose parents can pay a reduced fee at time of service.  [facility] [place]  [address], [place] [phone] Accepts children up to age 27 who are enrolled in [place] or [place] Health Choice; pregnant women with a Medicaid card; and children who have applied for Medicaid or [place] Health Choice, but were declined, whose parents can pay a reduced fee at time of service.  [facility]  [address], [place] [phone] Patients are seen by appointment only. Walk-ins are not accepted. [facility] will see patients 18 years  of age and older. Monday - Tuesday (8am-5pm) Most Wednesdays (8:30-5pm) $30 per visit, cash only  [facility]  [address], [facility] [phone] Patients are seen by appointment only. Walk-ins are not accepted. [facility] will see patients 110 years of age and older. One Wednesday Evening (Monthly: Volunteer Based).  $30 per visit, cash only  [employer]  [phone] for adults; Children under age 16, call Graduate Pediatric Dentistry at [phone]. Children aged 58-14, please call [phone] to request a pediatric application.  Dental services are provided in all areas of dental care including fillings, crowns and bridges, complete and partial dentures, implants, gum treatment, root canals, and extractions. Preventive care is also provided. Treatment is provided to both adults and children. Patients are selected via a lottery and there is often a waiting list.   [facility] [address] [place]  [phone] [email or web address]   [facility] [address] [place], [place] [phone], Ext. [phone] Second and Fourth Thursday of each month, opens at 6:30 AM; Clinic ends at 9 AM.  Patients are seen on a first-come first-served basis, and a  limited number are seen during each clinic.   [facility]  [address] [place], [place] [phone]   Eligibility Requirements You must have lived in [place], [place], or [place] counties for at least the last three months.   You cannot be eligible for state or federal sponsored [place], including [employer], [place], or [employer].   You generally cannot be eligible for healthcare insurance through your employer.    How to apply: Eligibility screenings are held every Tuesday and Wednesday afternoon from 1:00 pm until 4:00 pm. You do not need an appointment for the interview!  [facility] [address], [place], [place] [phone]   [facility] Health Department  [phone]   [facility] Health Department  [phone]   [facility] Health Department  [phone]

## 2014-04-14 NOTE — ED Notes (Signed)
Pt states R sided upper and lower dental pain. Ongoing x3 days, no relief with OTC medications. 9/10 pain upon arrival to ED. NAD.

## 2014-04-14 NOTE — ED Notes (Signed)
Pt sts both top and bottom wisdom teeth are infected. sts 3 days pain and swelling.

## 2014-10-31 ENCOUNTER — Encounter (HOSPITAL_COMMUNITY): Payer: Self-pay | Admitting: Emergency Medicine

## 2014-10-31 ENCOUNTER — Emergency Department (HOSPITAL_COMMUNITY): Payer: Medicaid Other

## 2014-10-31 ENCOUNTER — Emergency Department (HOSPITAL_COMMUNITY)
Admission: EM | Admit: 2014-10-31 | Discharge: 2014-10-31 | Disposition: A | Discharge status: Home / Self Care | Payer: Medicaid Other | Attending: Emergency Medicine | Admitting: Emergency Medicine

## 2014-10-31 DIAGNOSIS — Z792 Long term (current) use of antibiotics: Secondary | ICD-10-CM | POA: Insufficient documentation

## 2014-10-31 DIAGNOSIS — S62629A Displaced fracture of medial phalanx of unspecified finger, initial encounter for closed fracture: Secondary | ICD-10-CM

## 2014-10-31 DIAGNOSIS — Y929 Unspecified place or not applicable: Secondary | ICD-10-CM | POA: Diagnosis not present

## 2014-10-31 DIAGNOSIS — S62632A Displaced fracture of distal phalanx of right middle finger, initial encounter for closed fracture: Secondary | ICD-10-CM | POA: Insufficient documentation

## 2014-10-31 DIAGNOSIS — Y999 Unspecified external cause status: Secondary | ICD-10-CM | POA: Diagnosis not present

## 2014-10-31 DIAGNOSIS — Z720 Tobacco use: Secondary | ICD-10-CM | POA: Diagnosis not present

## 2014-10-31 DIAGNOSIS — R200 Anesthesia of skin: Secondary | ICD-10-CM | POA: Insufficient documentation

## 2014-10-31 DIAGNOSIS — S6991XA Unspecified injury of right wrist, hand and finger(s), initial encounter: Secondary | ICD-10-CM | POA: Diagnosis present

## 2014-10-31 DIAGNOSIS — Y9389 Activity, other specified: Secondary | ICD-10-CM | POA: Insufficient documentation

## 2014-10-31 MED ORDER — IBUPROFEN 400 MG PO TABS
800.0000 mg | ORAL_TABLET | Freq: Once | ORAL | Status: AC
  Administered 2014-10-31: 800 mg via ORAL
  Filled 2014-10-31: qty 2

## 2014-10-31 MED ORDER — NAPROXEN 500 MG PO TABS: 500.0000 mg | ORAL_TABLET | Freq: Two times a day (BID) | ORAL | Status: AC

## 2014-10-31 NOTE — ED Notes (Signed)
Patient's visitor complaining on length of time it is taking before patient goes to xray. Just called xray and they state patient is next on the list to go.

## 2014-10-31 NOTE — Discharge Instructions (Signed)
Cast or Splint Care °Casts and splints support injured limbs and keep bones from moving while they heal. It is important to care for your cast or splint at home.   °HOME CARE INSTRUCTIONS °· Keep the cast or splint uncovered during the drying period. It can take 24 to 48 hours to dry if it is made of plaster. A fiberglass cast will dry in less than 1 hour. °· Do not rest the cast on anything harder than a pillow for the first 24 hours. °· Do not put weight on your injured limb or apply pressure to the cast until your health care provider gives you permission. °· Keep the cast or splint dry. Wet casts or splints can lose their shape and may not support the limb as well. A wet cast that has lost its shape can also create harmful pressure on your skin when it dries. Also, wet skin can become infected. °· Cover the cast or splint with a plastic bag when bathing or when out in the rain or snow. If the cast is on the trunk of the body, take sponge baths until the cast is removed. °· If your cast does become wet, dry it with a towel or a blow dryer on the cool setting only. °· Keep your cast or splint clean. Soiled casts may be wiped with a moistened cloth. °· Do not place any hard or soft foreign objects under your cast or splint, such as cotton, toilet paper, lotion, or powder. °· Do not try to scratch the skin under the cast with any object. The object could get stuck inside the cast. Also, scratching could lead to an infection. If itching is a problem, use a blow dryer on a cool setting to relieve discomfort. °· Do not trim or cut your cast or remove padding from inside of it. °· Exercise all joints next to the injury that are not immobilized by the cast or splint. For example, if you have a long leg cast, exercise the hip joint and toes. If you have an arm cast or splint, exercise the shoulder, elbow, thumb, and fingers. °· Elevate your injured arm or leg on 1 or 2 pillows for the first 1 to 3 days to decrease  swelling and pain. It is best if you can comfortably elevate your cast so it is higher than your heart. °SEEK MEDICAL CARE IF:  °· Your cast or splint cracks. °· Your cast or splint is too tight or too loose. °· You have unbearable itching inside the cast. °· Your cast becomes wet or develops a soft spot or area. °· You have a bad smell coming from inside your cast. °· You get an object stuck under your cast. °· Your skin around the cast becomes red or raw. °· You have new pain or worsening pain after the cast has been applied. °SEEK IMMEDIATE MEDICAL CARE IF:  °· You have fluid leaking through the cast. °· You are unable to move your fingers or toes. °· You have discolored (blue or white), cool, painful, or very swollen fingers or toes beyond the cast. °· You have tingling or numbness around the injured area. °· You have severe pain or pressure under the cast. °· You have any difficulty with your breathing or have shortness of breath. °· You have chest pain. °Document Released: 05/07/2000 Document Revised: 02/28/2013 Document Reviewed: 11/16/2012 °ExitCare® Patient Information ©2015 [employer], [employer]. This information is not intended to replace advice given to you by your health care   provider. Make sure you discuss any questions you have with your health care provider. ° °Finger Fracture °Fractures of fingers are breaks in the bones of the fingers. There are many types of fractures. There are different ways of treating these fractures. Your health care provider will discuss the best way to treat your fracture. °CAUSES °Traumatic injury is the main cause of broken fingers. These include: °· Injuries while playing sports. °· Workplace injuries. °· Falls. °RISK FACTORS °Activities that can increase your risk of finger fractures include: °· Sports. °· Workplace activities that involve machinery. °· A condition called osteoporosis, which can make your bones less dense and cause them to fracture more easily. °SIGNS AND  SYMPTOMS °The main symptoms of a broken finger are pain and swelling within 15 minutes after the injury. Other symptoms include: °· Bruising of your finger. °· Stiffness of your finger. °· Numbness of your finger. °· Exposed bones (compound fracture) if the fracture is severe. °DIAGNOSIS  °The best way to diagnose a broken bone is with X-ray imaging. Additionally, your health care provider will use this X-ray image to evaluate the position of the broken finger bones.  °TREATMENT  °Finger fractures can be treated with:  °· Nonreduction--This means the bones are in place. The finger is splinted without changing the positions of the bone pieces. The splint is usually left on for about a week to 10 days. This will depend on your fracture and what your health care provider thinks. °· Closed reduction--The bones are put back into position without using surgery. The finger is then splinted. °· Open reduction and internal fixation--The fracture site is opened. Then the bone pieces are fixed into place with pins or some type of hardware. This is seldom required. It depends on the severity of the fracture. °HOME CARE INSTRUCTIONS  °· Follow your health care provider's instructions regarding activities, exercises, and physical therapy. °· Only take over-the-counter or prescription medicines for pain, discomfort, or fever as directed by your health care provider. °SEEK MEDICAL CARE IF: °You have pain or swelling that limits the motion or use of your fingers. °SEEK IMMEDIATE MEDICAL CARE IF:  °Your finger becomes numb. °MAKE SURE YOU:  °· Understand these instructions. °· Will watch your condition. °· Will get help right away if you are not doing well or get worse. °Document Released: 08/22/2000 Document Revised: 02/28/2013 Document Reviewed: 12/20/2012 °ExitCare® Patient Information ©2015 [employer], [employer]. This information is not intended to replace advice given to you by your health care provider. Make sure you discuss any  questions you have with your health care provider. ° °
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
## 2014-10-31 NOTE — ED Notes (Signed)
Patient was given a ice pack. 
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
## 2015-04-10 ENCOUNTER — Emergency Department (HOSPITAL_COMMUNITY)
Admission: EM | Admit: 2015-04-10 | Discharge: 2015-04-10 | Disposition: A | Discharge status: Home / Self Care | Payer: Medicaid Other | Attending: Emergency Medicine | Admitting: Emergency Medicine

## 2015-04-10 ENCOUNTER — Encounter (HOSPITAL_COMMUNITY): Payer: Self-pay | Admitting: Neurology

## 2015-04-10 DIAGNOSIS — K002 Abnormalities of size and form of teeth: Secondary | ICD-10-CM | POA: Insufficient documentation

## 2015-04-10 DIAGNOSIS — K0889 Other specified disorders of teeth and supporting structures: Secondary | ICD-10-CM

## 2015-04-10 DIAGNOSIS — K029 Dental caries, unspecified: Secondary | ICD-10-CM | POA: Diagnosis not present

## 2015-04-10 DIAGNOSIS — Z79899 Other long term (current) drug therapy: Secondary | ICD-10-CM | POA: Insufficient documentation

## 2015-04-10 DIAGNOSIS — Z792 Long term (current) use of antibiotics: Secondary | ICD-10-CM | POA: Diagnosis not present

## 2015-04-10 MED ORDER — NAPROXEN 500 MG PO TABS: 500.0000 mg | ORAL_TABLET | Freq: Two times a day (BID) | ORAL | Status: AC

## 2015-04-10 MED ORDER — PENICILLIN V POTASSIUM 500 MG PO TABS: 500.0000 mg | ORAL_TABLET | Freq: Four times a day (QID) | ORAL | Status: AC

## 2015-04-10 NOTE — ED Notes (Signed)
Secondary assessment being completed and documented by PA 

## 2015-04-10 NOTE — ED Provider Notes (Signed)
CSN: [id number]     Arrival date & time 04/10/15  1237 History  By signing my name below, I, Corey Schaefer, attest that this documentation has been prepared under the direction and in the presence of Hermena Swint, PA-C. Electronically Signed: Angelene Giovanni, ED Scribe. 04/10/2015. 3:04 PM.     Chief Complaint  Patient presents with  . Dental Pain   The history is provided by the patient. No language interpreter was used.   HPI Comments: Corey Schaefer is a 29 y.o. male who presents to the Emergency Department complaining of gradually worsening constant moderate upper left dental pain onset one week and a half ago. Pain is increased with cold food and cold air. He has not tried any OTC pain relievers. He denies any fevers, chills, cheek swelling, neck pain, SOB, trouble swallowing, n/v, numbness, or weakness. He does not follow with a dentist and has not seen one for this problem.   History reviewed. No pertinent past medical history. History reviewed. No pertinent past surgical history. No family history on file. Social History  Substance Use Topics  . Smoking status: Current Every Day Smoker -- 1.00 packs/day    Types: Cigarettes  . Smokeless tobacco: None  . Alcohol Use: 1.8 oz/week    3 Cans of beer per week     Comment: 3 40oz per day.     Review of Systems  HENT: Positive for dental problem.   All other systems reviewed and are negative.     Allergies  Review of patient's allergies indicates no known allergies.  Home Medications   Prior to Admission medications   Medication Sig Start Date End Date Taking? Authorizing Provider  DPH-Lido-AlHydr-MgHydr-Simeth (FIRST-MOUTHWASH BLM) SUSP Use as directed 10 mLs in the mouth or throat 3 (three) times daily as needed (mouth pain). 04/14/14   Mercedes Camprubi-Soms, PA-C  HYDROcodone-acetaminophen (NORCO) 5-325 MG per tablet Take 1-2 tablets by mouth every 6 (six) hours as needed for severe pain. 04/14/14   Mercedes  Camprubi-Soms, PA-C  HYDROcodone-acetaminophen (NORCO/VICODIN) 5-325 MG per tablet Take 1 tablet by mouth every 6 (six) hours as needed for moderate pain. 03/11/14   Charlestine Night, PA-C  ibuprofen (ADVIL,MOTRIN) 200 MG tablet Take 200 mg by mouth every 6 (six) hours as needed for mild pain.    Historical Provider, MD  ibuprofen (ADVIL,MOTRIN) 800 MG tablet Take 1 tablet (800 mg total) by mouth every 8 (eight) hours as needed. 03/11/14   Charlestine Night, PA-C  naproxen (NAPROSYN) 500 MG tablet Take 1 tablet (500 mg total) by mouth 2 (two) times daily with a meal. 10/31/14   Everlene Farrier, PA-C  naproxen (NAPROSYN) 500 MG tablet Take 1 tablet (500 mg total) by mouth 2 (two) times daily. 04/10/15   Deadrian Toya, PA-C  penicillin v potassium (VEETID) 500 MG tablet Take 1 tablet (500 mg total) by mouth 4 (four) times daily. 03/11/14   Charlestine Night, PA-C  penicillin v potassium (VEETID) 500 MG tablet Take 1 tablet (500 mg total) by mouth 4 (four) times daily. 04/10/15 04/17/15  Keiosha Cancro, PA-C   BP 117/82 mmHg  Pulse 73  Temp(Src) 97.6 F (36.4 C) (Oral)  Resp 14  SpO2 99% Physical Exam  Constitutional: He appears well-developed and well-nourished. No distress.  Non-toxic appearing  HENT:  Head: Normocephalic and atraumatic.  Right Ear: External ear normal.  Left Ear: External ear normal.  Mouth/Throat: Oropharynx is clear and moist and mucous membranes are normal. No trismus in the jaw. Abnormal  dentition. Dental caries present. No dental abscesses.    Poor dentition throughout. TTP over left upper 3rd molar. No obvious abscess. No swelling or erythema of cheek. No trismus  Eyes: Conjunctivae are normal. Right eye exhibits no discharge. Left eye exhibits no discharge. No scleral icterus.  Neck: Normal range of motion. Neck supple. No edema and normal range of motion present.  Neck is supple. No cervical adenopathy. FROM intact without pain. No soft tissue swelling.    Cardiovascular: Normal rate.   Pulmonary/Chest: Effort normal.  Breathing unlabored   Musculoskeletal: Normal range of motion.  Moves all extremities spontaneously  Lymphadenopathy:    He has no cervical adenopathy.  Neurological: He is alert. Coordination normal.  Skin: Skin is warm and dry.  Psychiatric: He has a normal mood and affect. His behavior is normal.  Nursing note and vitals reviewed.   ED Course  Procedures (including critical care time) DIAGNOSTIC STUDIES: Oxygen Saturation is 99% on RA, normal by my interpretation.    COORDINATION OF CARE: 2:01 PM- Pt advised of plan for treatment and pt agrees. Pt informed that ED no longer gives narcotics for dental pain. Will receive Penicillin and Naprosyn. Will also provide a referral to a dentist in the area.    Labs Review Labs Reviewed - No data to display  Imaging Review No results found.     EKG Interpretation None      MDM   Final diagnoses:  Pain, dental   Patient presenting with tooth pain. No obvious abscess on exam. No soft tissue swelling of the cheek or neck. Exam unconcerning for Ludwig's angina or spread of infection. Will discharge with penicillin and encouraged pt to follow-up with dentist. Resource guide given in discharge paperwork. Instructed to use OTC pain relievers. Return precautions discussed with pt and given in discharge paperwork. Stable for discharge.    I personally performed the services described in this documentation, which was scribed in my presence. The recorded information has been reviewed and is accurate.    Alveta HeimlichStevi Bridgett Hattabaugh, PA-C 04/10/15 1807  Raeford RazorStephen Kohut, MD 04/11/15 [phone]

## 2015-04-10 NOTE — Discharge Instructions (Signed)
Scheduled follow-up appointment with a dentist. I have provided dental clinics and the resource guide that he may call. Take your penicillin 4 times a day for one week. Return to the emergency department with difficulty breathing, difficulty swallowing , worsening pain, painful chewing or any other new or concerning symptoms.

## 2015-04-10 NOTE — ED Notes (Signed)
Pt reports left upper dental pain for 1 week with swelling to left cheek for 1 week. Airway intact. In NAD

## 2015-09-26 ENCOUNTER — Emergency Department (HOSPITAL_COMMUNITY)
Admission: EM | Admit: 2015-09-26 | Discharge: 2015-09-26 | Disposition: A | Discharge status: Home / Self Care | Payer: Medicaid Other | Attending: Emergency Medicine | Admitting: Emergency Medicine

## 2015-09-26 ENCOUNTER — Encounter (HOSPITAL_COMMUNITY): Payer: Self-pay | Admitting: *Deleted

## 2015-09-26 ENCOUNTER — Emergency Department (HOSPITAL_COMMUNITY): Payer: Medicaid Other

## 2015-09-26 DIAGNOSIS — S01112A Laceration without foreign body of left eyelid and periocular area, initial encounter: Secondary | ICD-10-CM | POA: Insufficient documentation

## 2015-09-26 DIAGNOSIS — Z791 Long term (current) use of non-steroidal anti-inflammatories (NSAID): Secondary | ICD-10-CM | POA: Insufficient documentation

## 2015-09-26 DIAGNOSIS — Z23 Encounter for immunization: Secondary | ICD-10-CM | POA: Insufficient documentation

## 2015-09-26 DIAGNOSIS — S022XXA Fracture of nasal bones, initial encounter for closed fracture: Secondary | ICD-10-CM | POA: Diagnosis not present

## 2015-09-26 DIAGNOSIS — Y998 Other external cause status: Secondary | ICD-10-CM | POA: Diagnosis not present

## 2015-09-26 DIAGNOSIS — S6992XA Unspecified injury of left wrist, hand and finger(s), initial encounter: Secondary | ICD-10-CM | POA: Insufficient documentation

## 2015-09-26 DIAGNOSIS — Y9389 Activity, other specified: Secondary | ICD-10-CM | POA: Diagnosis not present

## 2015-09-26 DIAGNOSIS — W228XXA Striking against or struck by other objects, initial encounter: Secondary | ICD-10-CM | POA: Insufficient documentation

## 2015-09-26 DIAGNOSIS — S0121XA Laceration without foreign body of nose, initial encounter: Secondary | ICD-10-CM | POA: Diagnosis not present

## 2015-09-26 DIAGNOSIS — M25532 Pain in left wrist: Secondary | ICD-10-CM

## 2015-09-26 DIAGNOSIS — Y9289 Other specified places as the place of occurrence of the external cause: Secondary | ICD-10-CM | POA: Insufficient documentation

## 2015-09-26 DIAGNOSIS — F17210 Nicotine dependence, cigarettes, uncomplicated: Secondary | ICD-10-CM | POA: Diagnosis not present

## 2015-09-26 DIAGNOSIS — Z792 Long term (current) use of antibiotics: Secondary | ICD-10-CM | POA: Insufficient documentation

## 2015-09-26 DIAGNOSIS — S0181XA Laceration without foreign body of other part of head, initial encounter: Secondary | ICD-10-CM

## 2015-09-26 DIAGNOSIS — S0990XA Unspecified injury of head, initial encounter: Secondary | ICD-10-CM | POA: Diagnosis present

## 2015-09-26 MED ORDER — HYDROCODONE-ACETAMINOPHEN 5-325 MG PO TABS
1.0000 | ORAL_TABLET | Freq: Once | ORAL | Status: AC
  Administered 2015-09-26: 1 via ORAL
  Filled 2015-09-26: qty 1

## 2015-09-26 MED ORDER — TETANUS-DIPHTH-ACELL PERTUSSIS 5-2.5-18.5 LF-MCG/0.5 IM SUSP
0.5000 mL | Freq: Once | INTRAMUSCULAR | Status: AC
  Administered 2015-09-26: 0.5 mL via INTRAMUSCULAR
  Filled 2015-09-26: qty 0.5

## 2015-09-26 MED ORDER — LIDOCAINE HCL (PF) 1 % IJ SOLN
5.0000 mL | Freq: Once | INTRAMUSCULAR | Status: AC
  Administered 2015-09-26: 5 mL
  Filled 2015-09-26: qty 5
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
## 2015-09-26 NOTE — Discharge Instructions (Signed)
Facial Laceration  Suture removal in 7 days. Monitor for signs of infection (increasing redness, pain, pus-like drainage).  Keep wound clean and dry. Apply ice to help reduce swelling. Please use tylenol for pain.  A facial laceration is a cut on the face. These injuries can be painful and cause bleeding. Lacerations usually heal quickly, but they need special care to reduce scarring. DIAGNOSIS  Your health care provider will take a medical history, ask for details about how the injury occurred, and examine the wound to determine how deep the cut is. TREATMENT  Some facial lacerations may not require closure. Others may not be able to be closed because of an increased risk of infection. The risk of infection and the chance for successful closure will depend on various factors, including the amount of time since the injury occurred. The wound may be cleaned to help prevent infection. If closure is appropriate, pain medicines may be given if needed. Your health care provider will use stitches (sutures), wound glue (adhesive), or skin adhesive strips to repair the laceration. These tools bring the skin edges together to allow for faster healing and a better cosmetic outcome. If needed, you may also be given a tetanus shot. HOME CARE INSTRUCTIONS  Only take over-the-counter or prescription medicines as directed by your health care provider.  Follow your health care provider's instructions for wound care. These instructions will vary depending on the technique used for closing the wound. For Sutures:  Keep the wound clean and dry.   If you were given a bandage (dressing), you should change it at least once a day. Also change the dressing if it becomes wet or dirty, or as directed by your health care provider.   Wash the wound with soap and water 2 times a day. Rinse the wound off with water to remove all soap. Pat the wound dry with a clean towel.   After cleaning, apply a thin layer of the  antibiotic ointment recommended by your health care provider. This will help prevent infection and keep the dressing from sticking.   You may shower as usual after the first 24 hours. Do not soak the wound in water until the sutures are removed.   Get your sutures removed as directed by your health care provider. With facial lacerations, sutures should usually be taken out after 4-5 days to avoid stitch marks.   Wait a few days after your sutures are removed before applying any makeup. For Skin Adhesive Strips:  Keep the wound clean and dry.   Do not get the skin adhesive strips wet. You may bathe carefully, using caution to keep the wound dry.   If the wound gets wet, pat it dry with a clean towel.   Skin adhesive strips will fall off on their own. You may trim the strips as the wound heals. Do not remove skin adhesive strips that are still stuck to the wound. They will fall off in time.  For Wound Adhesive:  You may briefly wet your wound in the shower or bath. Do not soak or scrub the wound. Do not swim. Avoid periods of heavy sweating until the skin adhesive has fallen off on its own. After showering or bathing, gently pat the wound dry with a clean towel.   Do not apply liquid medicine, cream medicine, ointment medicine, or makeup to your wound while the skin adhesive is in place. This may loosen the film before your wound is healed.   If a dressing  is placed over the wound, be careful not to apply tape directly over the skin adhesive. This may cause the adhesive to be pulled off before the wound is healed.   Avoid prolonged exposure to sunlight or tanning lamps while the skin adhesive is in place.  The skin adhesive will usually remain in place for 5-10 days, then naturally fall off the skin. Do not pick at the adhesive film.  After Healing: Once the wound has healed, cover the wound with sunscreen during the day for 1 full year. This can help minimize scarring. Exposure  to ultraviolet light in the first year will darken the scar. It can take 1-2 years for the scar to lose its redness and to heal completely.  SEEK MEDICAL CARE IF:  You have a fever. SEEK IMMEDIATE MEDICAL CARE IF:  You have redness, pain, or swelling around the wound.   You see ayellowish-white fluid (pus) coming from the wound.    This information is not intended to replace advice given to you by your health care provider. Make sure you discuss any questions you have with your health care provider.   Document Released: 06/17/2004 Document Revised: 05/31/2014 Document Reviewed: 12/21/2012 Elsevier Interactive Patient Education 2016 [employer]  Wrist Pain There are many things that can cause wrist pain. Some common causes include:  An injury to the wrist area, such as a sprain, strain, or fracture.  Overuse of the joint.  A condition that causes increased pressure on a nerve in the wrist (carpal tunnel syndrome).  Wear and tear of the joints that occurs with aging (osteoarthritis).  A variety of other types of arthritis. Sometimes, the cause of wrist pain is not known. The pain often goes away when you follow your health care provider's instructions for relieving pain at home. If your wrist pain continues, tests may need to be done to diagnose your condition. HOME CARE INSTRUCTIONS Pay attention to any changes in your symptoms. Take these actions to help with your pain:  Rest the wrist area for at least 48 hours or as told by your health care provider.  If directed, apply ice to the injured area:  Put ice in a plastic bag.  Place a towel between your skin and the bag.  Leave the ice on for 20 minutes, 2-3 times per day.  Keep your arm raised (elevated) above the level of your heart while you are sitting or lying down.  If a splint or elastic bandage has been applied, use it as told by your health care provider.  Remove the splint or bandage only as told by your  health care provider.  Loosen the splint or bandage if your fingers become numb or have a tingling feeling, or if they turn cold or blue.  Take over-the-counter and prescription medicines only as told by your health care provider.  Keep all follow-up visits as told by your health care provider. This is important. SEEK MEDICAL CARE IF:  Your pain is not helped by treatment.  Your pain gets worse. SEEK IMMEDIATE MEDICAL CARE IF:  Your fingers become swollen.  Your fingers turn white, very red, or cold and blue.  Your fingers are numb or have a tingling feeling.  You have difficulty moving your fingers.   This information is not intended to replace advice given to you by your health care provider. Make sure you discuss any questions you have with your health care provider.   Document Released: 02/17/2005 Document Revised: 01/29/2015 Document  Reviewed: 09/25/2014 Elsevier Interactive Patient Education [employer]
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
## 2015-10-10 ENCOUNTER — Encounter (HOSPITAL_COMMUNITY): Payer: Self-pay | Admitting: Emergency Medicine

## 2015-10-10 ENCOUNTER — Emergency Department (HOSPITAL_COMMUNITY)
Admission: EM | Admit: 2015-10-10 | Discharge: 2015-10-10 | Disposition: A | Discharge status: Home / Self Care | Payer: Medicaid Other | Attending: Emergency Medicine | Admitting: Emergency Medicine

## 2015-10-10 DIAGNOSIS — Z79899 Other long term (current) drug therapy: Secondary | ICD-10-CM | POA: Diagnosis not present

## 2015-10-10 DIAGNOSIS — Z4802 Encounter for removal of sutures: Secondary | ICD-10-CM | POA: Diagnosis not present

## 2015-10-10 DIAGNOSIS — F17210 Nicotine dependence, cigarettes, uncomplicated: Secondary | ICD-10-CM | POA: Insufficient documentation

## 2015-10-10 DIAGNOSIS — Z792 Long term (current) use of antibiotics: Secondary | ICD-10-CM | POA: Diagnosis not present

## 2015-10-10 DIAGNOSIS — Z791 Long term (current) use of non-steroidal anti-inflammatories (NSAID): Secondary | ICD-10-CM | POA: Insufficient documentation

## 2015-10-10 NOTE — ED Notes (Signed)
Here for suture removal. Sutures to left brow from 09/26/15.

## 2015-10-10 NOTE — ED Provider Notes (Signed)
CSN: [id number]     Arrival date & time 10/10/15  1423 History  By signing my name below, I, Corey Schaefer, attest that this documentation has been prepared under the direction and in the presence of Sharilyn SitesLisa Marbeth Smedley, PA-C Electronically Signed: Charline BillsEssence Schaefer, ED Scribe 10/10/2015 at 3:01 PM.   Chief Complaint  Patient presents with  . Suture / Staple Removal   The history is provided by the patient. No language interpreter was used.   HPI Comments: Percell BeltJonta R Schaefer is a 30 y.o. male who presents to the Emergency Department for suture removal. Pt was seen in the ED on 09/26/15 for a laceration to the medial left eye brow which required 13 sutures. He has applied peroxide and cocoa butter to the area. Pt denies bleeding or drainage from the site. No other complaints reported at this time.  Tetanus updated at prior ED visit.  No past medical history on file. No past surgical history on file. No family history on file. Social History  Substance Use Topics  . Smoking status: Current Every Day Smoker -- 1.00 packs/day    Types: Cigarettes  . Smokeless tobacco: Not on file  . Alcohol Use: 1.8 oz/week    3 Cans of beer per week     Comment: 3 40oz per day.     Review of Systems  Constitutional: Negative for fever.  Skin: Positive for wound.  All other systems reviewed and are negative.  Allergies  Review of patient's allergies indicates no known allergies.  Home Medications   Prior to Admission medications   Medication Sig Start Date End Date Taking? Authorizing Provider  DPH-Lido-AlHydr-MgHydr-Simeth (FIRST-MOUTHWASH BLM) SUSP Use as directed 10 mLs in the mouth or throat 3 (three) times daily as needed (mouth pain). 04/14/14   Mercedes Camprubi-Soms, PA-C  HYDROcodone-acetaminophen (NORCO) 5-325 MG per tablet Take 1-2 tablets by mouth every 6 (six) hours as needed for severe pain. 04/14/14   Mercedes Camprubi-Soms, PA-C  HYDROcodone-acetaminophen (NORCO/VICODIN) 5-325 MG per tablet Take 1  tablet by mouth every 6 (six) hours as needed for moderate pain. 03/11/14   Charlestine Nighthristopher Lawyer, PA-C  ibuprofen (ADVIL,MOTRIN) 200 MG tablet Take 200 mg by mouth every 6 (six) hours as needed for mild pain.    Historical Provider, MD  ibuprofen (ADVIL,MOTRIN) 800 MG tablet Take 1 tablet (800 mg total) by mouth every 8 (eight) hours as needed. 03/11/14   Charlestine Nighthristopher Lawyer, PA-C  naproxen (NAPROSYN) 500 MG tablet Take 1 tablet (500 mg total) by mouth 2 (two) times daily with a meal. 10/31/14   Everlene FarrierWilliam Dansie, PA-C  naproxen (NAPROSYN) 500 MG tablet Take 1 tablet (500 mg total) by mouth 2 (two) times daily. 04/10/15   Stevi Barrett, PA-C  penicillin v potassium (VEETID) 500 MG tablet Take 1 tablet (500 mg total) by mouth 4 (four) times daily. 03/11/14   Christopher Lawyer, PA-C   BP 118/82 mmHg  Pulse 85  Temp(Src) 98.6 F (37 C) (Oral)  Resp 20  SpO2 98% Physical Exam  Constitutional: He is oriented to person, place, and time. He appears well-developed and well-nourished.  HENT:  Head: Normocephalic and atraumatic.  Mouth/Throat: Oropharynx is clear and moist.  11 prolene sutures in place to left eyebrow, well healed without signs of infection  Eyes: Conjunctivae and EOM are normal. Pupils are equal, round, and reactive to light.  Neck: Normal range of motion.  Cardiovascular: Normal rate, regular rhythm and normal heart sounds.   Pulmonary/Chest: Effort normal and breath sounds normal.  Abdominal: Soft. Bowel sounds are normal.  Musculoskeletal: Normal range of motion.  Neurological: He is alert and oriented to person, place, and time.  Skin: Skin is warm and dry.  Psychiatric: He has a normal mood and affect.  Nursing note and vitals reviewed.  ED Course  Procedures (including critical care time) DIAGNOSTIC STUDIES: Oxygen Saturation is 98% on RA, normal by my interpretation.    COORDINATION OF CARE: 2:58 PM-Discussed treatment plan which includes suture removal with pt at bedside  and pt agreed to plan.   SUTURE REMOVAL Performed by: Sharilyn Sites, PA-C Consent: Verbal consent obtained. Patient identity confirmed: provided demographic data Time out: Immediately prior to procedure a "time out" was called to verify the correct patient, procedure, equipment, support staff and site/side marked as required. Location: L brow Wound Appearance: clean Sutures/Staples Removed: 11 Patient tolerance: Patient tolerated the procedure well with no immediate complications.  Labs Review Labs Reviewed - No data to display  Imaging Review No results found.   EKG Interpretation None      MDM   Final diagnoses:  Visit for suture removal   30 y.o. M here for suture removal.  Wound appears well-healed without any signs of infection currently. Sutures were removed without difficulty, patient tolerated well. Discussed follow-up wound care and scar preventing measures.  Discussed plan with patient, he/she acknowledged understanding and agreed with plan of care.  Return precautions given for new or worsening symptoms.  I personally performed the services described in this documentation, which was scribed in my presence. The recorded information has been reviewed and is accurate.  Garlon Hatchet, PA-C 10/10/15 1509  Mancel Bale, MD 10/10/15 1710

## 2015-10-10 NOTE — Discharge Instructions (Signed)
May continue home wound care with mederma or cocoa butter to help reduce scarring. Return to the ED for new or worsening symptoms.

## 2016-01-03 ENCOUNTER — Emergency Department (HOSPITAL_COMMUNITY): Payer: Medicaid Other

## 2016-01-03 ENCOUNTER — Encounter (HOSPITAL_COMMUNITY): Payer: Self-pay | Admitting: Emergency Medicine

## 2016-01-03 ENCOUNTER — Emergency Department (HOSPITAL_COMMUNITY)
Admission: EM | Admit: 2016-01-03 | Discharge: 2016-01-04 | Disposition: A | Discharge status: Home / Self Care | Payer: Medicaid Other | Attending: Emergency Medicine | Admitting: Emergency Medicine

## 2016-01-03 DIAGNOSIS — F17200 Nicotine dependence, unspecified, uncomplicated: Secondary | ICD-10-CM | POA: Diagnosis not present

## 2016-01-03 DIAGNOSIS — R791 Abnormal coagulation profile: Secondary | ICD-10-CM | POA: Insufficient documentation

## 2016-01-03 DIAGNOSIS — S01511A Laceration without foreign body of lip, initial encounter: Secondary | ICD-10-CM

## 2016-01-03 DIAGNOSIS — Y939 Activity, unspecified: Secondary | ICD-10-CM | POA: Insufficient documentation

## 2016-01-03 DIAGNOSIS — S21212A Laceration without foreign body of left back wall of thorax without penetration into thoracic cavity, initial encounter: Secondary | ICD-10-CM

## 2016-01-03 DIAGNOSIS — Y999 Unspecified external cause status: Secondary | ICD-10-CM | POA: Insufficient documentation

## 2016-01-03 DIAGNOSIS — S31010A Laceration without foreign body of lower back and pelvis without penetration into retroperitoneum, initial encounter: Secondary | ICD-10-CM | POA: Insufficient documentation

## 2016-01-03 DIAGNOSIS — S6991XA Unspecified injury of right wrist, hand and finger(s), initial encounter: Secondary | ICD-10-CM | POA: Diagnosis present

## 2016-01-03 DIAGNOSIS — E872 Acidosis, unspecified: Secondary | ICD-10-CM

## 2016-01-03 DIAGNOSIS — Y929 Unspecified place or not applicable: Secondary | ICD-10-CM | POA: Insufficient documentation

## 2016-01-03 DIAGNOSIS — S61511A Laceration without foreign body of right wrist, initial encounter: Secondary | ICD-10-CM

## 2016-01-03 DIAGNOSIS — T1490XA Injury, unspecified, initial encounter: Secondary | ICD-10-CM

## 2016-01-03 LAB — COMPREHENSIVE METABOLIC PANEL
ALBUMIN: 4.3 g/dL (ref 3.5–5.0)
ALK PHOS: 54 U/L (ref 38–126)
ALT: 17 U/L (ref 17–63)
ANION GAP: 12 (ref 5–15)
AST: 24 U/L (ref 15–41)
BILIRUBIN TOTAL: 0.1 mg/dL — AB (ref 0.3–1.2)
BUN: 11 mg/dL (ref 6–20)
CALCIUM: 9 mg/dL (ref 8.9–10.3)
CO2: 21 mmol/L — ABNORMAL LOW (ref 22–32)
Chloride: 105 mmol/L (ref 101–111)
Creatinine, Ser: 0.93 mg/dL (ref 0.61–1.24)
GFR calc Af Amer: >60 mL/min (ref >60)
GFR calc non Af Amer: >60 mL/min (ref >60)
GLUCOSE: 98 mg/dL (ref 65–99)
Potassium: 3.7 mmol/L (ref 3.5–5.1)
Sodium: 138 mmol/L (ref 135–145)
TOTAL PROTEIN: 8 g/dL (ref 6.5–8.1)

## 2016-01-03 LAB — CBC
HCT: 41.8 % (ref 39.0–52.0)
Hemoglobin: 13.9 g/dL (ref 13.0–17.0)
MCH: 29.3 pg (ref 26.0–34.0)
MCHC: 33.3 g/dL (ref 30.0–36.0)
MCV: 88 fL (ref 78.0–100.0)
Platelets: 285 10*3/uL (ref 150–400)
RBC: 4.75 MIL/uL (ref 4.22–5.81)
RDW: 13.3 % (ref 11.5–15.5)
WBC: 7.6 10*3/uL (ref 4.0–10.5)

## 2016-01-03 LAB — PROTIME-INR
INR: 1.04
Prothrombin Time: 13.6 seconds (ref 11.4–15.2)

## 2016-01-03 LAB — I-STAT CHEM 8, ED
BUN: 12 mg/dL (ref 6–20)
CALCIUM ION: 1.1 mmol/L — AB (ref 1.13–1.30)
CHLORIDE: 107 mmol/L (ref 101–111)
CREATININE: 1 mg/dL (ref 0.61–1.24)
Glucose, Bld: 97 mg/dL (ref 65–99)
HCT: 44 % (ref 39.0–52.0)
Hemoglobin: 15 g/dL (ref 13.0–17.0)
Potassium: 3.8 mmol/L (ref 3.5–5.1)
SODIUM: 143 mmol/L (ref 135–145)
TCO2: 21 mmol/L (ref 0–100)

## 2016-01-03 LAB — I-STAT CG4 LACTIC ACID, ED: LACTIC ACID, VENOUS: 3.21 mmol/L — AB (ref 0.5–1.9)

## 2016-01-03 MED ORDER — MORPHINE SULFATE (PF) 4 MG/ML IV SOLN
4.0000 mg | Freq: Once | INTRAVENOUS | Status: AC
  Administered 2016-01-03: 4 mg via INTRAVENOUS

## 2016-01-03 MED ORDER — TETANUS-DIPHTH-ACELL PERTUSSIS 5-2.5-18.5 LF-MCG/0.5 IM SUSP
0.5000 mL | Freq: Once | INTRAMUSCULAR | Status: AC
  Administered 2016-01-03: 0.5 mL via INTRAMUSCULAR

## 2016-01-03 MED ORDER — LIDOCAINE-EPINEPHRINE 1 %-1:100000 IJ SOLN
20.0000 mL | Freq: Once | INTRAMUSCULAR | Status: AC
  Administered 2016-01-04: 20 mL via INTRADERMAL
  Filled 2016-01-03: qty 1

## 2016-01-03 MED ORDER — ONDANSETRON HCL 4 MG/2ML IJ SOLN
INTRAMUSCULAR | Status: AC
  Filled 2016-01-03: qty 2

## 2016-01-03 MED ORDER — MORPHINE SULFATE (PF) 2 MG/ML IV SOLN
INTRAVENOUS | Status: AC
  Filled 2016-01-03: qty 2

## 2016-01-03 MED ORDER — TETANUS-DIPHTH-ACELL PERTUSSIS 5-2.5-18.5 LF-MCG/0.5 IM SUSP
INTRAMUSCULAR | Status: AC
  Filled 2016-01-03: qty 0.5

## 2016-01-03 MED ORDER — ONDANSETRON HCL 4 MG/2ML IJ SOLN
4.0000 mg | Freq: Once | INTRAMUSCULAR | Status: AC
  Administered 2016-01-03: 4 mg via INTRAVENOUS

## 2016-01-03 MED ORDER — SODIUM CHLORIDE 0.9 % IV BOLUS (SEPSIS)
1000.0000 mL | Freq: Once | INTRAVENOUS | Status: AC
  Administered 2016-01-03: 1000 mL via INTRAVENOUS
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
## 2016-01-04 LAB — TYPE AND SCREEN
ABO/RH(D): A NEG
Antibody Screen: NEGATIVE
UNIT DIVISION: 0
UNIT DIVISION: 0

## 2016-01-04 LAB — ABO/RH: ABO/RH(D): A NEG

## 2016-01-04 MED ORDER — AMOXICILLIN-POT CLAVULANATE 875-125 MG PO TABS: 1.0000 | ORAL_TABLET | Freq: Two times a day (BID) | ORAL | 0 refills | Status: AC

## 2016-01-04 MED ORDER — ACETAMINOPHEN 500 MG PO TABS
1000.0000 mg | ORAL_TABLET | Freq: Once | ORAL | Status: AC
  Administered 2016-01-04: 1000 mg via ORAL
  Filled 2016-01-04: qty 2

## 2016-01-04 MED ORDER — AMOXICILLIN-POT CLAVULANATE 875-125 MG PO TABS
1.0000 | ORAL_TABLET | Freq: Once | ORAL | Status: AC
  Administered 2016-01-04: 1 via ORAL
  Filled 2016-01-04: qty 1
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
## 2016-01-04 NOTE — Discharge Instructions (Addendum)
Keep bandage on right arm wound, keep it clean and dry until your follow-up appointment    Laceration Care, Adult A laceration is a cut that goes through all layers of the skin. The cut also goes into the tissue that is right under the skin. Some cuts heal on their own. Others need to be closed with stitches (sutures), staples, skin adhesive strips, or wound glue. Taking care of your cut lowers your risk of infection and helps your cut to heal better. HOW TO TAKE CARE OF YOUR CUT For stitches or staples:  Keep the wound clean and dry.  If you were given a bandage (dressing), you should change it at least one time per day or as told by your doctor. You should also change it if it gets wet or dirty.  Keep the wound completely dry for the first 24 hours or as told by your doctor. After that time, you may take a shower or a bath. However, make sure that the wound is not soaked in water until after the stitches or staples have been removed.  Clean the wound one time each day or as told by your doctor:  Wash the wound with soap and water.  Rinse the wound with water until all of the soap comes off.  Pat the wound dry with a clean towel. Do not rub the wound.  After you clean the wound, put a thin layer of antibiotic ointment on it as told by your doctor. This ointment:  Helps to prevent infection.  Keeps the bandage from sticking to the wound.  Have your stitches or staples removed as told by your doctor. If your doctor used skin adhesive strips:   Keep the wound clean and dry.  If you were given a bandage, you should change it at least one time per day or as told by your doctor. You should also change it if it gets dirty or wet.  Do not get the skin adhesive strips wet. You can take a shower or a bath, but be careful to keep the wound dry.  If the wound gets wet, pat it dry with a clean towel. Do not rub the wound.  Skin adhesive strips fall off on their own. You can trim the  strips as the wound heals. Do not remove any strips that are still stuck to the wound. They will fall off after a while. If your doctor used wound glue:  Try to keep your wound dry, but you may briefly wet it in the shower or bath. Do not soak the wound in water, such as by swimming.  After you take a shower or a bath, gently pat the wound dry with a clean towel. Do not rub the wound.  Do not do any activities that will make you really sweaty until the skin glue has fallen off on its own.  Do not apply liquid, cream, or ointment medicine to your wound while the skin glue is still on.  If you were given a bandage, you should change it at least one time per day or as told by your doctor. You should also change it if it gets dirty or wet.  If a bandage is placed over the wound, do not let the tape for the bandage touch the skin glue.  Do not pick at the glue. The skin glue usually stays on for 5-10 days. Then, it falls off of the skin. General Instructions  To help prevent scarring, make sure to  cover your wound with sunscreen whenever you are outside after stitches are removed, after adhesive strips are removed, or when wound glue stays in place and the wound is healed. Make sure to wear a sunscreen of at least 30 SPF.  Take over-the-counter and prescription medicines only as told by your doctor.  If you were given antibiotic medicine or ointment, take or apply it as told by your doctor. Do not stop using the antibiotic even if your wound is getting better.  Do not scratch or pick at the wound.  Keep all follow-up visits as told by your doctor. This is important.  Check your wound every day for signs of infection. Watch for:  Redness, swelling, or pain.  Fluid, blood, or pus.  Raise (elevate) the injured area above the level of your heart while you are sitting or lying down, if possible. GET HELP IF:  You got a tetanus shot and you have any of these problems at the injection  site:  Swelling.  Very bad pain.  Redness.  Bleeding.  You have a fever.  A wound that was closed breaks open.  You notice a bad smell coming from your wound or your bandage.  You notice something coming out of the wound, such as wood or glass.  Medicine does not help your pain.  You have more redness, swelling, or pain at the site of your wound.  You have fluid, blood, or pus coming from your wound.  You notice a change in the color of your skin near your wound.  You need to change the bandage often because fluid, blood, or pus is coming from the wound.  You start to have a new rash.  You start to have numbness around the wound. GET HELP RIGHT AWAY IF:  You have very bad swelling around the wound.  Your pain suddenly gets worse and is very bad.  You notice painful lumps near the wound or on skin that is anywhere on your body.  You have a red streak going away from your wound.  The wound is on your hand or foot and you cannot move a finger or toe like you usually can.  The wound is on your hand or foot and you notice that your fingers or toes look pale or bluish.   This information is not intended to replace advice given to you by your health care provider. Make sure you discuss any questions you have with your health care provider.   Document Released: 10/27/2007 Document Revised: 09/24/2014 Document Reviewed: 05/06/2014 Elsevier Interactive Patient Education [employer]2016 [employer]

## 2016-01-05 ENCOUNTER — Encounter (HOSPITAL_COMMUNITY): Payer: Self-pay | Admitting: Emergency Medicine

## 2017-10-27 IMAGING — CR DG CHEST 1V PORT
1 series · 1 of 1 positions shown · non-contrast
Comparison: None.

CLINICAL DATA: Upper back knife laceration.

EXAM:
PORTABLE CHEST 1 VIEW

[AP]
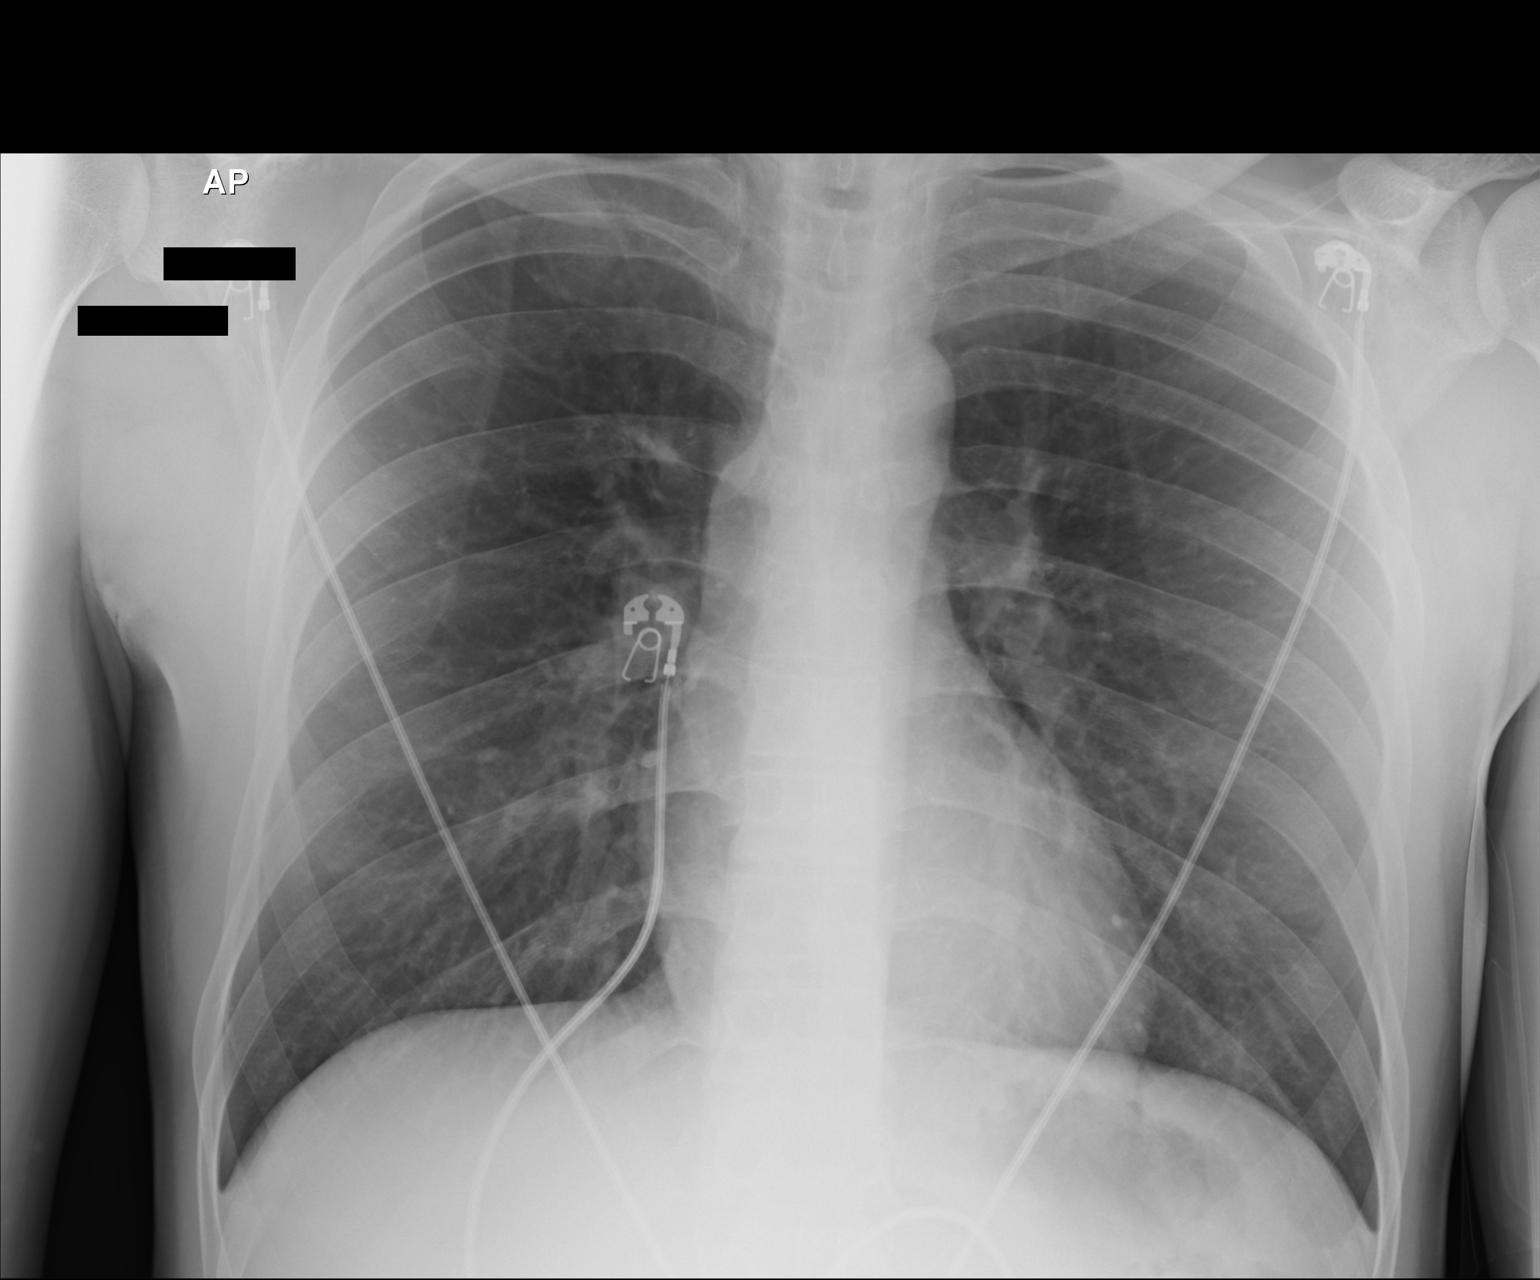

[1 of 1 positions shown; findings below may reference images not displayed]

FINDINGS: The heart size and mediastinal contours are within normal limits.
Both lungs are clear. The visualized skeletal structures are
unremarkable.
IMPRESSION: Normal examination.

## 2021-01-27 ENCOUNTER — Encounter: Payer: Self-pay | Admitting: Podiatry

## 2021-01-27 ENCOUNTER — Ambulatory Visit: Payer: Medicaid Other | Admitting: Podiatry

## 2021-01-27 ENCOUNTER — Ambulatory Visit (INDEPENDENT_AMBULATORY_CARE_PROVIDER_SITE_OTHER): Payer: Medicaid Other

## 2021-01-27 ENCOUNTER — Telehealth: Payer: Self-pay | Admitting: Podiatry

## 2021-01-27 ENCOUNTER — Other Ambulatory Visit: Payer: Self-pay

## 2021-01-27 DIAGNOSIS — S99922A Unspecified injury of left foot, initial encounter: Secondary | ICD-10-CM

## 2021-01-27 DIAGNOSIS — S99822A Other specified injuries of left foot, initial encounter: Secondary | ICD-10-CM | POA: Diagnosis not present

## 2021-01-27 DIAGNOSIS — Q828 Other specified congenital malformations of skin: Secondary | ICD-10-CM | POA: Diagnosis not present

## 2021-01-27 DIAGNOSIS — B353 Tinea pedis: Secondary | ICD-10-CM

## 2021-01-27 MED ORDER — TERBINAFINE HCL 250 MG PO TABS: 250.0000 mg | ORAL_TABLET | Freq: Every day | ORAL | 0 refills | Status: AC

## 2021-01-27 NOTE — Progress Notes (Signed)
  Subjective:  Patient ID: Corey Schaefer, male    DOB: 1985-09-14,  MRN: [id number]  Chief Complaint  Patient presents with   Foot Pain    PT states he has right heel pain. Pt states he stepped on something and didn't feel it.     35 y.o. male presents with the above complaint. History confirmed with patient.  His mother is a patient mine.  He thinks he may have stepped on something on the heel.  There is a hard area of skin that is been very painful  Objective:  Physical Exam: warm, good capillary refill, no trophic changes or ulcerative lesions, normal DP and PT pulses, and normal sensory exam.  Interdigital tinea pedis fourth and fifth interspaces bilateral  Right Foot: Deep porokeratosis plantar heel  No images are attached to the encounter.  Radiographs: Multiple views x-ray of the right foot: No foreign body detectable on x-ray Assessment:   1. Foot injury, left, initial encounter   2. Porokeratosis   3. Tinea pedis of both feet      Plan:  Patient was evaluated and treated and all questions answered.  Suspect he has a deep porokeratosis I debrided the lesion and applied salinocaine ointment.  I recommended urea cream for this daily.  There is no evidence of deep puncture wound or retained foreign body.  Discussed with him this unfortunate is not a covered service under Medicaid and we can continue to do it or he can have a pedicurist do it as well but will need to pay out of pocket in the future with an ABN if he would like [place] to do it  Discussed the etiology and treatment options for tinea pedis.  Discussed topical and oral treatment.  Recommended oral treatment with 4-week Lamisil course this was sent to his pharmacy.  This was sent to the patient's pharmacy.  Also discussed appropriate foot hygiene, use of antifungal spray such as Tinactin in shoes, as well as cleaning foot surfaces such as showers and bathroom floors with bleach.   Return if symptoms worsen or fail to  improve.

## 2021-01-27 NOTE — Telephone Encounter (Signed)
Patient called stating his prescription has not been called in and he was concerned about it .   Please advise.Marland Kitchen

## 2021-01-27 NOTE — Patient Instructions (Signed)
Look for urea 40% cream or ointment and apply to the thickened dry skin / calluses. This can be bought over the counter, at a pharmacy or online such as [employer].  

## 2021-01-28 NOTE — Telephone Encounter (Signed)
Called the patient an left a voicemail letting him know his perception has been sent to the pharmacy

## 2021-01-29 ENCOUNTER — Other Ambulatory Visit: Payer: Self-pay | Admitting: Podiatry

## 2021-01-29 DIAGNOSIS — Q828 Other specified congenital malformations of skin: Secondary | ICD-10-CM

## 2021-08-31 ENCOUNTER — Ambulatory Visit: Payer: Medicaid Other | Admitting: Podiatry

## 2021-09-15 ENCOUNTER — Ambulatory Visit: Payer: Medicaid Other | Admitting: Podiatry

## 2023-11-29 ENCOUNTER — Emergency Department (HOSPITAL_COMMUNITY)
Admission: EM | Admit: 2023-11-29 | Discharge: 2023-11-29 | Disposition: A | Discharge status: Home / Self Care | Attending: Emergency Medicine | Admitting: Emergency Medicine

## 2023-11-29 ENCOUNTER — Other Ambulatory Visit: Payer: Self-pay

## 2023-11-29 DIAGNOSIS — J029 Acute pharyngitis, unspecified: Secondary | ICD-10-CM | POA: Insufficient documentation

## 2023-11-29 DIAGNOSIS — R112 Nausea with vomiting, unspecified: Secondary | ICD-10-CM | POA: Diagnosis not present

## 2023-11-29 LAB — GROUP A STREP BY PCR: Group A Strep by PCR: NOT DETECTED

## 2023-11-29 MED ORDER — LIDOCAINE VISCOUS HCL 2 % MT SOLN
15.0000 mL | Freq: Once | OROMUCOSAL | Status: AC
  Administered 2023-11-29: 15 mL via OROMUCOSAL
  Filled 2023-11-29: qty 15

## 2023-11-29 MED ORDER — IBUPROFEN 400 MG PO TABS
600.0000 mg | ORAL_TABLET | Freq: Once | ORAL | Status: AC
  Administered 2023-11-29: 600 mg via ORAL
  Filled 2023-11-29: qty 1

## 2023-11-29 MED ORDER — LIDOCAINE VISCOUS HCL 2 % MT SOLN
15.0000 mL | OROMUCOSAL | 0 refills | Status: AC | PRN
Start: 2023-11-29 — End: ?

## 2023-11-29 MED ORDER — IBUPROFEN 600 MG PO TABS: 600.0000 mg | ORAL_TABLET | Freq: Four times a day (QID) | ORAL | 0 refills | Status: AC | PRN

## 2023-11-29 NOTE — ED Triage Notes (Signed)
 Pt. Stated, I've had pain and difficult swallowing since July 5.

## 2023-11-29 NOTE — ED Provider Notes (Signed)
 [place] EMERGENCY DEPARTMENT AT [facility] Provider Note   CSN: [id number] Arrival date & time: 11/29/23  9055     Patient presents with: Sore Throat   Corey Schaefer is a 38 y.o. male patient presents to the emerged part today for further evaluation of sore throat this been present since the fourth.  Patient states that he was drinking excessively on 4 July and ended up going to sleep.  He woke up and had intractable nausea and vomiting which then started having a sore throat.  He denies any fever or chills.  Vomiting has since stopped.    Sore Throat       Prior to Admission medications   Medication Sig Start Date End Date Taking? Authorizing Provider  ibuprofen  (ADVIL ) 600 MG tablet Take 1 tablet (600 mg total) by mouth every 6 (six) hours as needed. 11/29/23  Yes Shakenna Herrero M, PA-C  lidocaine  (XYLOCAINE ) 2 % solution Use as directed 15 mLs in the mouth or throat as needed for mouth pain. 11/29/23  Yes Mathius Birkeland M, PA-C  DPH-Lido-AlHydr-MgHydr-Simeth (FIRST-MOUTHWASH BLM) SUSP Use as directed 10 mLs in the mouth or throat 3 (three) times daily as needed (mouth pain). 04/14/14   Street, [place], PA-C  HYDROcodone -acetaminophen  (NORCO) 5-325 MG per tablet Take 1-2 tablets by mouth every 6 (six) hours as needed for severe pain. 04/14/14   Street, [place], PA-C  HYDROcodone -acetaminophen  (NORCO/VICODIN) 5-325 MG per tablet Take 1 tablet by mouth every 6 (six) hours as needed for moderate pain. 03/11/14   Lawyer, Lonni, PA-C  naproxen  (NAPROSYN ) 500 MG tablet Take 1 tablet (500 mg total) by mouth 2 (two) times daily with a meal. 10/31/14   Eluterio Fallow, PA-C  naproxen  (NAPROSYN ) 500 MG tablet Take 1 tablet (500 mg total) by mouth 2 (two) times daily. 04/10/15   Barrett, Mimi, PA-C  penicillin  v potassium (VEETID) 500 MG tablet Take 1 tablet (500 mg total) by mouth 4 (four) times daily. 03/11/14   Lawyer, Lonni, PA-C  sertraline (ZOLOFT) 100 MG tablet  Take 100 mg by mouth daily.    [provider]    Allergies: Patient has no known allergies.    Review of Systems  All other systems reviewed and are negative.   Updated Vital Signs BP (!) 136/90 (BP Location: Left Arm)   Pulse (!) 55   Temp 98.3 F (36.8 C) (Oral)   Resp 17   Ht 6' 1 (1.854 m)   Wt 81.6 kg   SpO2 98%   BMI 23.75 kg/m   Physical Exam Vitals and nursing note reviewed.  Constitutional:      Appearance: Normal appearance.  HENT:     Head: Normocephalic and atraumatic.     Mouth/Throat:     Comments: No uvular shift.  Uvula appears normal.  Tongue is normal.  No tonsillar hypertrophy or exudate.  There is mild posterior pharyngeal erythema. Eyes:     General:        Right eye: No discharge.        Left eye: No discharge.     Conjunctiva/sclera: Conjunctivae normal.  Pulmonary:     Effort: Pulmonary effort is normal.  Skin:    General: Skin is warm and dry.     Findings: No rash.  Neurological:     General: No focal deficit present.     Mental Status: He is alert.  Psychiatric:        Mood and Affect: Mood normal.  Behavior: Behavior normal.     (all labs ordered are listed, but only abnormal results are displayed) Labs Reviewed  GROUP A STREP BY PCR    EKG: None  Radiology: No results found.   Procedures   Medications Ordered in the ED  lidocaine  (XYLOCAINE ) 2 % viscous mouth solution 15 mL (15 mLs Mouth/Throat Given 11/29/23 1202)  ibuprofen  (ADVIL ) tablet 600 mg (600 mg Oral Given 11/29/23 1202)    Clinical Course as of 11/29/23 1217  Tue Nov 29, 2023  1215 Group A Strep by PCR Negative.  [CF]    Clinical Course User Index [CF] Corey Schaefer HERO, PA-C    Medical Decision Making KENETH Schaefer is a 38 y.o. male patient who presents to the emergency department today for further evaluation of sore throat.  I favor this is likely related to the intractable vomiting he had after a negative binge drinking.  Patient is  nontoxic-appearing.  No obvious signs of RPA or PTA.  Patient is afebrile.  Strep is negative.  Will send him home with ibuprofen  and viscous Xylocaine .  Strict turn precautions were discussed.  He is safe for discharge at this time.   Amount and/or Complexity of Data Reviewed Labs:  Decision-making details documented in ED Course.  Risk Prescription drug management.    Final diagnoses:  Pharyngitis, unspecified etiology    ED Discharge Orders          Ordered    lidocaine  (XYLOCAINE ) 2 % solution  As needed        11/29/23 1215    ibuprofen  (ADVIL ) 600 MG tablet  Every 6 hours PRN        11/29/23 1216               Corey Schaefer [place], [place] 11/29/23 1217    Corey Hollering, MD 11/30/23 1140

## 2023-11-29 NOTE — ED Notes (Signed)
 Patient verbalizes understanding of discharge instructions. Opportunity for questioning and answers were provided. Pt discharged from ED.

## 2023-11-29 NOTE — Discharge Instructions (Signed)
 You tested negative for strep today.  This is likely just irritation from the vomiting that you had after 4 July.  I have given you 2 medications.  The first is 600 mg ibuprofen  tablet.  You can take this every 6 hours as needed for pain.  The second is lidocaine  which will help provide numbing to your throat.  Please follow-up with your primary care doctor.  You may return to the emergency department for any worsening symptoms.
